# Patient Record
Sex: Female | Born: 1980 | Race: White | Hispanic: No | Marital: Married | Smoking: Never smoker
Health system: Southern US, Community
[De-identification: ages and names within clinical notes are randomized; demographics above are authoritative.]

## PROBLEM LIST (undated history)

## (undated) DIAGNOSIS — K8020 Calculus of gallbladder without cholecystitis without obstruction: Secondary | ICD-10-CM

## (undated) DIAGNOSIS — K219 Gastro-esophageal reflux disease without esophagitis: Secondary | ICD-10-CM

## (undated) HISTORY — PX: WISDOM TOOTH EXTRACTION: SHX21

## (undated) HISTORY — DX: Calculus of gallbladder without cholecystitis without obstruction: K80.20

## (undated) HISTORY — PX: ROOT CANAL: SHX2363

## (undated) HISTORY — DX: Gastro-esophageal reflux disease without esophagitis: K21.9

---

## 2008-05-21 ENCOUNTER — Inpatient Hospital Stay (HOSPITAL_COMMUNITY): Admission: AD | Admit: 2008-05-21 | Discharge: 2008-05-21 | Payer: Self-pay | Admitting: Obstetrics & Gynecology

## 2010-05-24 NOTE — L&D Delivery Note (Signed)
Delivery Note At 5:14 PM a viable female was delivered via Vaginal, Spontaneous Delivery (Presentation: Left Occiput Anterior).  APGAR: 9, 9; weight 9 lb 4.2 oz (4201 g).   Placenta status: Intact, Spontaneous.  Cord: 3 vessels with the following complications: None.    Anesthesia: Epidural  Episiotomy: None Lacerations: 2nd degree Suture Repair: 2.0 vicryl Est. Blood Loss (mL):   Mom to postpartum.  Baby to nursery-stable.  JACKSON-MOORE,Lonna Rabold A 02/11/2011, 5:44 PM

## 2010-07-28 LAB — ABO/RH: RH Type: POSITIVE

## 2010-07-28 LAB — GC/CHLAMYDIA PROBE AMP, GENITAL
Chlamydia: NEGATIVE
Gonorrhea: NEGATIVE

## 2010-07-28 LAB — RUBELLA ANTIBODY, IGM: Rubella: IMMUNE

## 2010-07-28 LAB — HIV ANTIBODY (ROUTINE TESTING W REFLEX): HIV: NONREACTIVE

## 2010-07-28 LAB — ANTIBODY SCREEN: Antibody Screen: NEGATIVE

## 2011-02-04 ENCOUNTER — Other Ambulatory Visit: Payer: Self-pay | Admitting: Obstetrics & Gynecology

## 2011-02-04 DIAGNOSIS — O480 Post-term pregnancy: Secondary | ICD-10-CM

## 2011-02-07 ENCOUNTER — Inpatient Hospital Stay (HOSPITAL_COMMUNITY)
Admission: AD | Admit: 2011-02-07 | Discharge: 2011-02-07 | Disposition: A | Discharge status: Home Health | Payer: Medicaid Other | Source: Ambulatory Visit | Attending: Obstetrics & Gynecology | Admitting: Obstetrics & Gynecology

## 2011-02-07 ENCOUNTER — Encounter (HOSPITAL_COMMUNITY): Payer: Self-pay | Admitting: *Deleted

## 2011-02-07 DIAGNOSIS — O479 False labor, unspecified: Secondary | ICD-10-CM | POA: Insufficient documentation

## 2011-02-07 MED ORDER — ZOLPIDEM TARTRATE 5 MG PO TABS: 5.0000 mg | ORAL_TABLET | Freq: Once | ORAL | Status: DC

## 2011-02-07 NOTE — Progress Notes (Signed)
ambien offered to pt. Pt does not want ambien at this time.

## 2011-02-07 NOTE — Progress Notes (Signed)
Pt presents to [facility] for labor check.  efm and toco placed. Assessing.  

## 2011-02-07 NOTE — Progress Notes (Signed)
Dr. Gaynell Face notified of pt presenting for labor check.  Notified of reactive fetal strip with irregular ctx.  Notified of VE 2cm with no change from earlier this week.  Orders received to dc home.  Pt may have ambien if desires.

## 2011-02-07 NOTE — Progress Notes (Signed)
States she was 2/50 this past week.

## 2011-02-08 ENCOUNTER — Encounter (HOSPITAL_COMMUNITY): Payer: Self-pay | Admitting: *Deleted

## 2011-02-08 ENCOUNTER — Telehealth (HOSPITAL_COMMUNITY): Payer: Self-pay | Admitting: *Deleted

## 2011-02-08 NOTE — Telephone Encounter (Signed)
Preadmission screen  

## 2011-02-09 ENCOUNTER — Ambulatory Visit (HOSPITAL_COMMUNITY)
Admission: RE | Admit: 2011-02-09 | Discharge: 2011-02-09 | Disposition: A | Discharge status: Home / Self Care | Payer: Medicaid Other | Source: Ambulatory Visit | Attending: Obstetrics & Gynecology | Admitting: Obstetrics & Gynecology

## 2011-02-09 DIAGNOSIS — O480 Post-term pregnancy: Secondary | ICD-10-CM | POA: Insufficient documentation

## 2011-02-09 DIAGNOSIS — Z3689 Encounter for other specified antenatal screening: Secondary | ICD-10-CM | POA: Insufficient documentation

## 2011-02-10 ENCOUNTER — Inpatient Hospital Stay (HOSPITAL_COMMUNITY)
Admission: RE | Admit: 2011-02-10 | Discharge: 2011-02-13 | DRG: 775 | Disposition: A | Discharge status: Home / Self Care | Payer: Medicaid Other | Source: Ambulatory Visit | Attending: Obstetrics & Gynecology | Admitting: Obstetrics & Gynecology

## 2011-02-10 DIAGNOSIS — O480 Post-term pregnancy: Secondary | ICD-10-CM

## 2011-02-10 DIAGNOSIS — O9903 Anemia complicating the puerperium: Secondary | ICD-10-CM | POA: Diagnosis not present

## 2011-02-10 DIAGNOSIS — O99892 Other specified diseases and conditions complicating childbirth: Secondary | ICD-10-CM | POA: Diagnosis present

## 2011-02-10 DIAGNOSIS — Z22330 Carrier of Group B streptococcus: Secondary | ICD-10-CM

## 2011-02-10 DIAGNOSIS — D649 Anemia, unspecified: Secondary | ICD-10-CM | POA: Diagnosis not present

## 2011-02-10 DIAGNOSIS — D62 Acute posthemorrhagic anemia: Secondary | ICD-10-CM | POA: Diagnosis not present

## 2011-02-10 LAB — CBC
Hemoglobin: 11.8 g/dL — ABNORMAL LOW (ref 12.0–15.0)
MCHC: 32.6 g/dL (ref 30.0–36.0)
Platelets: 244 10*3/uL (ref 150–400)

## 2011-02-10 MED ORDER — LIDOCAINE HCL (PF) 1 % IJ SOLN: 30.0000 mL | INTRAMUSCULAR | Status: DC | PRN

## 2011-02-10 MED ORDER — ZOLPIDEM TARTRATE 10 MG PO TABS: 10.0000 mg | ORAL_TABLET | Freq: Every evening | ORAL | Status: DC | PRN

## 2011-02-10 MED ORDER — FLEET ENEMA 7-19 GM/118ML RE ENEM: 1.0000 | ENEMA | RECTAL | Status: DC | PRN

## 2011-02-10 MED ORDER — ACETAMINOPHEN 325 MG PO TABS: 650.0000 mg | ORAL_TABLET | ORAL | Status: DC | PRN

## 2011-02-10 MED ORDER — PENICILLIN G POTASSIUM 5000000 UNITS IJ SOLR: 2.5000 10*6.[IU] | INTRAVENOUS | Status: DC

## 2011-02-10 MED ORDER — IBUPROFEN 600 MG PO TABS: 600.0000 mg | ORAL_TABLET | Freq: Four times a day (QID) | ORAL | Status: DC | PRN

## 2011-02-10 MED ORDER — LACTATED RINGERS IV SOLN: INTRAVENOUS | Status: DC

## 2011-02-10 MED ORDER — SODIUM CHLORIDE 0.9 % IV SOLN: 250.0000 mL | INTRAVENOUS | Status: DC

## 2011-02-10 MED ORDER — TERBUTALINE SULFATE 1 MG/ML IJ SOLN: 0.2500 mg | Freq: Once | INTRAMUSCULAR | Status: AC | PRN

## 2011-02-10 MED ORDER — OXYCODONE-ACETAMINOPHEN 5-325 MG PO TABS: 2.0000 | ORAL_TABLET | ORAL | Status: DC | PRN

## 2011-02-10 MED ORDER — MISOPROSTOL 25 MCG QUARTER TABLET
25.0000 ug | ORAL_TABLET | ORAL | Status: DC | PRN
  Administered 2011-02-11: 25 ug via VAGINAL
  Filled 2011-02-10 (×2): qty 0.25

## 2011-02-10 MED ORDER — OXYTOCIN BOLUS FROM INFUSION
500.0000 mL | Freq: Once | INTRAVENOUS | Status: DC
  Filled 2011-02-10: qty 500

## 2011-02-10 MED ORDER — CITRIC ACID-SODIUM CITRATE 334-500 MG/5ML PO SOLN: 30.0000 mL | ORAL | Status: DC | PRN

## 2011-02-10 MED ORDER — LACTATED RINGERS IV SOLN
500.0000 mL | INTRAVENOUS | Status: DC | PRN
  Administered 2011-02-10: 1000 mL via INTRAVENOUS

## 2011-02-10 MED ORDER — SODIUM CHLORIDE 0.9 % IJ SOLN
3.0000 mL | Freq: Two times a day (BID) | INTRAMUSCULAR | Status: DC
  Administered 2011-02-10: 3 mL via INTRAVENOUS

## 2011-02-10 MED ORDER — ONDANSETRON HCL 4 MG/2ML IJ SOLN: 4.0000 mg | Freq: Four times a day (QID) | INTRAMUSCULAR | Status: DC | PRN

## 2011-02-10 MED ORDER — OXYTOCIN 20 UNITS IN LACTATED RINGERS INFUSION - SIMPLE: 125.0000 mL/h | Freq: Once | INTRAVENOUS | Status: DC

## 2011-02-10 MED ORDER — PENICILLIN G POTASSIUM 5000000 UNITS IJ SOLR: 5.0000 10*6.[IU] | Freq: Once | INTRAMUSCULAR | Status: DC

## 2011-02-10 MED ORDER — PENICILLIN G POTASSIUM 5000000 UNITS IJ SOLR
2.5000 10*6.[IU] | INTRAVENOUS | Status: DC
  Filled 2011-02-10: qty 2.5

## 2011-02-10 MED ORDER — PENICILLIN G POTASSIUM 5000000 UNITS IJ SOLR
5.0000 10*6.[IU] | Freq: Once | INTRAVENOUS | Status: DC
  Filled 2011-02-10: qty 5

## 2011-02-10 MED ORDER — SODIUM CHLORIDE 0.9 % IJ SOLN: 3.0000 mL | INTRAMUSCULAR | Status: DC | PRN

## 2011-02-11 ENCOUNTER — Encounter (HOSPITAL_COMMUNITY): Payer: Self-pay | Admitting: Anesthesiology

## 2011-02-11 ENCOUNTER — Encounter (HOSPITAL_COMMUNITY): Payer: Self-pay

## 2011-02-11 ENCOUNTER — Inpatient Hospital Stay (HOSPITAL_COMMUNITY): Payer: Medicaid Other | Admitting: Anesthesiology

## 2011-02-11 DIAGNOSIS — O480 Post-term pregnancy: Secondary | ICD-10-CM | POA: Diagnosis present

## 2011-02-11 LAB — RPR: RPR Ser Ql: NONREACTIVE

## 2011-02-11 MED ORDER — DIPHENHYDRAMINE HCL 50 MG/ML IJ SOLN: 12.5000 mg | INTRAMUSCULAR | Status: DC | PRN

## 2011-02-11 MED ORDER — FENTANYL 2.5 MCG/ML BUPIVACAINE 1/10 % EPIDURAL INFUSION (WH - ANES)
14.0000 mL/h | INTRAMUSCULAR | Status: DC
  Administered 2011-02-11 (×2): 14 mL/h via EPIDURAL
  Filled 2011-02-11 (×2): qty 60

## 2011-02-11 MED ORDER — CITRIC ACID-SODIUM CITRATE 334-500 MG/5ML PO SOLN: 30.0000 mL | ORAL | Status: DC | PRN

## 2011-02-11 MED ORDER — LIDOCAINE HCL (PF) 1 % IJ SOLN
30.0000 mL | INTRAMUSCULAR | Status: DC | PRN
  Administered 2011-02-11: 30 mL via SUBCUTANEOUS
  Filled 2011-02-11: qty 30

## 2011-02-11 MED ORDER — FENTANYL 2.5 MCG/ML BUPIVACAINE 1/10 % EPIDURAL INFUSION (WH - ANES)
INTRAMUSCULAR | Status: AC
  Administered 2011-02-11: 14 mL/h via EPIDURAL
  Filled 2011-02-11: qty 60

## 2011-02-11 MED ORDER — SODIUM CHLORIDE 0.9 % IJ SOLN: 3.0000 mL | INTRAMUSCULAR | Status: DC | PRN

## 2011-02-11 MED ORDER — FLEET ENEMA 7-19 GM/118ML RE ENEM: 1.0000 | ENEMA | RECTAL | Status: DC | PRN

## 2011-02-11 MED ORDER — BENZOCAINE-MENTHOL 20-0.5 % EX AERO: 1.0000 "application " | INHALATION_SPRAY | CUTANEOUS | Status: DC | PRN

## 2011-02-11 MED ORDER — EPHEDRINE 5 MG/ML INJ
10.0000 mg | INTRAVENOUS | Status: DC | PRN
  Administered 2011-02-11: 10 mg via INTRAVENOUS
  Filled 2011-02-11: qty 4

## 2011-02-11 MED ORDER — MAGNESIUM HYDROXIDE 400 MG/5ML PO SUSP: 30.0000 mL | ORAL | Status: DC | PRN

## 2011-02-11 MED ORDER — ONDANSETRON HCL 4 MG/2ML IJ SOLN: 4.0000 mg | Freq: Four times a day (QID) | INTRAMUSCULAR | Status: DC | PRN

## 2011-02-11 MED ORDER — ZOLPIDEM TARTRATE 5 MG PO TABS: 5.0000 mg | ORAL_TABLET | Freq: Every evening | ORAL | Status: DC | PRN

## 2011-02-11 MED ORDER — LACTATED RINGERS IV SOLN: 500.0000 mL | INTRAVENOUS | Status: DC | PRN

## 2011-02-11 MED ORDER — PHENYLEPHRINE 40 MCG/ML (10ML) SYRINGE FOR IV PUSH (FOR BLOOD PRESSURE SUPPORT)
80.0000 ug | PREFILLED_SYRINGE | INTRAVENOUS | Status: DC | PRN
  Filled 2011-02-11: qty 5

## 2011-02-11 MED ORDER — MISOPROSTOL 25 MCG QUARTER TABLET
25.0000 ug | ORAL_TABLET | ORAL | Status: DC | PRN
  Filled 2011-02-11: qty 1

## 2011-02-11 MED ORDER — ACETAMINOPHEN 325 MG PO TABS: 650.0000 mg | ORAL_TABLET | ORAL | Status: DC | PRN

## 2011-02-11 MED ORDER — IBUPROFEN 600 MG PO TABS
600.0000 mg | ORAL_TABLET | Freq: Four times a day (QID) | ORAL | Status: DC | PRN
  Administered 2011-02-11: 600 mg via ORAL
  Filled 2011-02-11: qty 1

## 2011-02-11 MED ORDER — OXYTOCIN BOLUS FROM INFUSION
500.0000 mL | Freq: Once | INTRAVENOUS | Status: DC
  Filled 2011-02-11: qty 500

## 2011-02-11 MED ORDER — BUTORPHANOL TARTRATE 2 MG/ML IJ SOLN
2.0000 mg | Freq: Once | INTRAMUSCULAR | Status: AC
  Administered 2011-02-11: 2 mg via INTRAVENOUS

## 2011-02-11 MED ORDER — PENICILLIN G POTASSIUM 5000000 UNITS IJ SOLR: 5.0000 10*6.[IU] | Freq: Once | INTRAVENOUS | Status: DC

## 2011-02-11 MED ORDER — LACTATED RINGERS IV SOLN: INTRAVENOUS | Status: DC

## 2011-02-11 MED ORDER — ONDANSETRON HCL 4 MG/2ML IJ SOLN: 4.0000 mg | INTRAMUSCULAR | Status: DC | PRN

## 2011-02-11 MED ORDER — BUTORPHANOL TARTRATE 2 MG/ML IJ SOLN
INTRAMUSCULAR | Status: AC
  Administered 2011-02-11: 2 mg via INTRAVENOUS
  Filled 2011-02-11: qty 1

## 2011-02-11 MED ORDER — IBUPROFEN 600 MG PO TABS: 600.0000 mg | ORAL_TABLET | Freq: Four times a day (QID) | ORAL | Status: DC | PRN

## 2011-02-11 MED ORDER — PENICILLIN G POTASSIUM 5000000 UNITS IJ SOLR
5.0000 10*6.[IU] | Freq: Once | INTRAMUSCULAR | Status: AC
  Administered 2011-02-11: 5 10*6.[IU] via INTRAVENOUS
  Filled 2011-02-11: qty 5

## 2011-02-11 MED ORDER — SENNOSIDES-DOCUSATE SODIUM 8.6-50 MG PO TABS
2.0000 | ORAL_TABLET | Freq: Every day | ORAL | Status: DC
  Administered 2011-02-12: 2 via ORAL

## 2011-02-11 MED ORDER — OXYTOCIN BOLUS FROM INFUSION: 500.0000 mL | Freq: Once | INTRAVENOUS | Status: DC

## 2011-02-11 MED ORDER — PRENATAL PLUS 27-1 MG PO TABS
1.0000 | ORAL_TABLET | Freq: Every day | ORAL | Status: DC
  Administered 2011-02-12 – 2011-02-13 (×2): 1 via ORAL
  Filled 2011-02-11 (×2): qty 1

## 2011-02-11 MED ORDER — MEDROXYPROGESTERONE ACETATE 150 MG/ML IM SUSP: 150.0000 mg | INTRAMUSCULAR | Status: DC | PRN

## 2011-02-11 MED ORDER — SODIUM CHLORIDE 0.9 % IV SOLN: 250.0000 mL | INTRAVENOUS | Status: DC

## 2011-02-11 MED ORDER — ONDANSETRON HCL 4 MG PO TABS: 4.0000 mg | ORAL_TABLET | ORAL | Status: DC | PRN

## 2011-02-11 MED ORDER — TERBUTALINE SULFATE 1 MG/ML IJ SOLN: 0.2500 mg | Freq: Once | INTRAMUSCULAR | Status: AC | PRN

## 2011-02-11 MED ORDER — DIPHENHYDRAMINE HCL 25 MG PO CAPS: 25.0000 mg | ORAL_CAPSULE | Freq: Four times a day (QID) | ORAL | Status: DC | PRN

## 2011-02-11 MED ORDER — SODIUM CHLORIDE 0.9 % IJ SOLN: 3.0000 mL | Freq: Two times a day (BID) | INTRAMUSCULAR | Status: DC

## 2011-02-11 MED ORDER — LACTATED RINGERS IV SOLN: 500.0000 mL | Freq: Once | INTRAVENOUS | Status: DC

## 2011-02-11 MED ORDER — OXYCODONE-ACETAMINOPHEN 5-325 MG PO TABS: 2.0000 | ORAL_TABLET | ORAL | Status: DC | PRN

## 2011-02-11 MED ORDER — TETANUS-DIPHTH-ACELL PERTUSSIS 5-2.5-18.5 LF-MCG/0.5 IM SUSP: 0.5000 mL | Freq: Once | INTRAMUSCULAR | Status: DC

## 2011-02-11 MED ORDER — FENTANYL 2.5 MCG/ML BUPIVACAINE 1/10 % EPIDURAL INFUSION (WH - ANES)
INTRAMUSCULAR | Status: DC | PRN
  Administered 2011-02-11: 14 mL/h via EPIDURAL

## 2011-02-11 MED ORDER — TERBUTALINE SULFATE 1 MG/ML IJ SOLN: 0.2500 mg | Freq: Once | INTRAMUSCULAR | Status: DC | PRN

## 2011-02-11 MED ORDER — IBUPROFEN 600 MG PO TABS
600.0000 mg | ORAL_TABLET | Freq: Four times a day (QID) | ORAL | Status: DC
  Administered 2011-02-11 – 2011-02-13 (×7): 600 mg via ORAL
  Filled 2011-02-11 (×6): qty 1

## 2011-02-11 MED ORDER — OXYTOCIN 20 UNITS IN LACTATED RINGERS INFUSION - SIMPLE
1.0000 m[IU]/min | INTRAVENOUS | Status: DC
  Administered 2011-02-11: 333 m[IU]/min via INTRAVENOUS
  Administered 2011-02-11: 4 m[IU]/min via INTRAVENOUS
  Administered 2011-02-11: 2 m[IU]/min via INTRAVENOUS
  Filled 2011-02-11: qty 1000

## 2011-02-11 MED ORDER — OXYTOCIN 20 UNITS IN LACTATED RINGERS INFUSION - SIMPLE: 125.0000 mL/h | Freq: Once | INTRAVENOUS | Status: DC

## 2011-02-11 MED ORDER — SODIUM CHLORIDE 0.9 % IJ SOLN
3.0000 mL | INTRAMUSCULAR | Status: DC | PRN
  Administered 2011-02-11: 3 mL via INTRAVENOUS

## 2011-02-11 MED ORDER — DIBUCAINE 1 % RE OINT: 1.0000 "application " | TOPICAL_OINTMENT | RECTAL | Status: DC | PRN

## 2011-02-11 MED ORDER — BENZOCAINE-MENTHOL 20-0.5 % EX AERO
INHALATION_SPRAY | CUTANEOUS | Status: AC
  Filled 2011-02-11: qty 56

## 2011-02-11 MED ORDER — LIDOCAINE HCL 1.5 % IJ SOLN
INTRAMUSCULAR | Status: DC | PRN
  Administered 2011-02-11 (×2): 5 mL via EPIDURAL

## 2011-02-11 MED ORDER — DEXTROSE 5 % IV SOLN: 2.5000 10*6.[IU] | INTRAVENOUS | Status: DC

## 2011-02-11 MED ORDER — MEASLES, MUMPS & RUBELLA VAC ~~LOC~~ INJ: 0.5000 mL | INJECTION | Freq: Once | SUBCUTANEOUS | Status: DC

## 2011-02-11 MED ORDER — PENICILLIN G POTASSIUM 5000000 UNITS IJ SOLR
2.5000 10*6.[IU] | INTRAMUSCULAR | Status: DC
  Administered 2011-02-11 (×2): 2.5 10*6.[IU] via INTRAVENOUS
  Filled 2011-02-11 (×8): qty 2.5

## 2011-02-11 MED ORDER — PHENYLEPHRINE 40 MCG/ML (10ML) SYRINGE FOR IV PUSH (FOR BLOOD PRESSURE SUPPORT)
PREFILLED_SYRINGE | INTRAVENOUS | Status: AC
  Filled 2011-02-11: qty 5

## 2011-02-11 MED ORDER — LANOLIN HYDROUS EX OINT: TOPICAL_OINTMENT | CUTANEOUS | Status: DC | PRN

## 2011-02-11 MED ORDER — WITCH HAZEL-GLYCERIN EX PADS: 1.0000 "application " | MEDICATED_PAD | CUTANEOUS | Status: DC | PRN

## 2011-02-11 MED ORDER — EPHEDRINE 5 MG/ML INJ
10.0000 mg | INTRAVENOUS | Status: DC | PRN
  Filled 2011-02-11: qty 4

## 2011-02-11 MED ORDER — EPHEDRINE 5 MG/ML INJ
INTRAVENOUS | Status: AC
  Administered 2011-02-11: 10 mg via INTRAVENOUS
  Filled 2011-02-11: qty 4

## 2011-02-11 MED ORDER — OXYCODONE-ACETAMINOPHEN 5-325 MG PO TABS: 1.0000 | ORAL_TABLET | ORAL | Status: DC | PRN

## 2011-02-11 MED ORDER — FERROUS SULFATE 325 (65 FE) MG PO TABS
325.0000 mg | ORAL_TABLET | Freq: Two times a day (BID) | ORAL | Status: DC
  Administered 2011-02-12 – 2011-02-13 (×2): 325 mg via ORAL
  Filled 2011-02-11 (×3): qty 1

## 2011-02-11 MED ORDER — LIDOCAINE HCL (PF) 1 % IJ SOLN: 30.0000 mL | INTRAMUSCULAR | Status: DC | PRN

## 2011-02-11 MED ORDER — LACTATED RINGERS IV SOLN
INTRAVENOUS | Status: DC
  Administered 2011-02-11 (×3): via INTRAVENOUS

## 2011-02-11 NOTE — Anesthesia Procedure Notes (Signed)
Epidural Patient location during procedure: OB Start time: 02/11/2011 8:04 AM End time: 02/11/2011 8:10 AM Reason for block: procedure for pain  Staffing Anesthesiologist: Sandrea Hughs Performed by: anesthesiologist   Preanesthetic Checklist Completed: patient identified, site marked, surgical consent, pre-op evaluation, timeout performed, IV checked, risks and benefits discussed and monitors and equipment checked  Epidural Patient position: sitting Prep: site prepped and draped and DuraPrep Patient monitoring: continuous pulse ox and blood pressure Approach: midline Injection technique: LOR air  Needle:  Needle type: Tuohy  Needle gauge: 17 G Needle length: 9 cm Needle insertion depth: 6 cm Catheter type: closed end flexible Catheter size: 19 Gauge Catheter at skin depth: 11 cm Test dose: negative and 1.5% lidocaine  Assessment Events: blood not aspirated, injection not painful, no injection resistance, negative IV test and no paresthesia

## 2011-02-11 NOTE — Anesthesia Preprocedure Evaluation (Signed)
Anesthesia Evaluation  Name, MR# and DOB Patient awake  General Assessment Comment  Reviewed: Allergy & Precautions, H&P , NPO status , Patient's Chart, lab work & pertinent test results  Airway Mallampati: II TM Distance: >3 FB Neck ROM: full    Dental No notable dental hx.    Pulmonary  clear to auscultation  pulmonary exam normalPulmonary Exam Normal breath sounds clear to auscultation none    Cardiovascular     Neuro/Psych Negative Neurological ROS  Negative Psych ROS  GI/Hepatic/Renal   negative Liver ROS  negative Renal ROS        Endo/Other  Negative Endocrine ROS (+)      Abdominal (+) obese,   Musculoskeletal negative musculoskeletal ROS (+)   Hematology negative hematology ROS (+)   Peds  Reproductive/Obstetrics (+) Pregnancy    Anesthesia Other Findings             Anesthesia Physical Anesthesia Plan  ASA: III  Anesthesia Plan: Epidural   Post-op Pain Management:    Induction:   Airway Management Planned:   Additional Equipment:   Intra-op Plan:   Post-operative Plan:   Informed Consent: I have reviewed the patients History and Physical, chart, labs and discussed the procedure including the risks, benefits and alternatives for the proposed anesthesia with the patient or authorized representative who has indicated his/her understanding and acceptance.     Plan Discussed with:   Anesthesia Plan Comments:         Anesthesia Quick Evaluation

## 2011-02-11 NOTE — H&P (Signed)
Ashley Best is a 30 y.o. female presenting for IOL secondary to post dates. Maternal Medical History:  Reason for admission: IOL  Fetal activity: Perceived fetal activity is normal.    Prenatal complications: no prenatal complications   OB History    Grav Para Term Preterm Abortions TAB SAB Ect Mult Living   1              Past Medical History  Diagnosis Date  . No pertinent past medical history   . GERD (gastroesophageal reflux disease)    Past Surgical History  Procedure Date  . Wisdom tooth extraction   . Root canal    Family History: family history includes Cancer in her paternal grandmother; Diabetes in her father; Early death in her maternal grandfather; Heart attack in her paternal grandfather and paternal uncle; Hypertension in her father and paternal grandfather; and Stroke in her maternal grandfather. Social History:  reports that she has never smoked. She does not have any smokeless tobacco history on file. She reports that she does not drink alcohol or use illicit drugs.  Review of Systems  Constitutional: Negative for fever.  Eyes: Negative for blurred vision.  Respiratory: Negative for shortness of breath.   Gastrointestinal: Negative for vomiting.  Skin: Negative for rash.  Neurological: Negative for headaches.    Dilation: 2 Effacement (%): 70 Station: -2 Exam by:: Ashley German RN Blood pressure 99/50, pulse 74, temperature 97.9 F (36.6 C), temperature source Oral, resp. rate 18, height 5\' 7"  (1.702 m), weight 102.513 kg (226 lb). Maternal Exam:  Abdomen: Patient reports no abdominal tenderness. Fetal presentation: vertex  Introitus: not evaluated.     Fetal Exam Fetal Monitor Review: Variability: moderate (6-25 bpm).   Pattern: accelerations present and no decelerations.    Fetal State Assessment: Category I - tracings are normal.     Physical Exam  Constitutional: She appears well-developed.  HENT:  Head: Normocephalic.  Neck: Neck  supple. No thyromegaly present.  Cardiovascular: Normal rate and regular rhythm.   Respiratory: Breath sounds normal.  GI: Soft. Bowel sounds are normal.  Skin: No rash noted.    Prenatal labs: ABO, Rh: O/Positive/-- (03/06 0000) Antibody: Negative (03/06 0000) Rubella:   RPR: NON REACTIVE (09/19 2345)  HBsAg: Negative (03/06 0000)  HIV: Non-reactive (03/06 0000)  GBS: Positive (08/18 0000)   Assessment/Plan: 30 y.o. w/an IUP @ [redacted]w[redacted]d for IOL secondary to post dates GBS positive  Admit Two-stage IOL GBS prophylaxis   JACKSON-MOORE,Samyria Rudie A 02/11/2011, 3:58 AM

## 2011-02-11 NOTE — Progress Notes (Signed)
Johara Lodwick is a 30 y.o. G1P0 at [redacted]w[redacted]d by LMP admitted for induction of labor due to Post dates. Due date 9/13.  Subjective: Comfortable  Objective: BP 101/55  Pulse 76  Temp(Src) 97.9 F (36.6 C) (Oral)  Resp 18  Ht 5\' 7"  (1.702 m)  Wt 102.513 kg (226 lb)  BMI 35.40 kg/m2  SpO2 96%      FHT:  FHR: 140 bpm, variability: moderate,  accelerations:  Present,  decelerations:  Absent UC:   regular, every 2 minutes SVE:   Dilation: 4 Effacement (%): 90 Station: -1 Exam by:: jackson-moore (IUPC placed)  Labs: Lab Results  Component Value Date   WBC 11.2* 02/10/2011   HGB 11.8* 02/10/2011   HCT 36.2 02/10/2011   MCV 85.8 02/10/2011   PLT 244 02/10/2011    Assessment / Plan: Induction of labor due to postterm,  progressing well on pitocin  Labor: Progressing normally Preeclampsia:  N/A Fetal Wellbeing:  Category I Pain Control:  Epidural I/D:  n/a Anticipated MOD:  NSVD  JACKSON-MOORE,Arlone Lenhardt A 02/11/2011, 9:24 AM

## 2011-02-12 LAB — CBC
MCH: 28.2 pg (ref 26.0–34.0)
MCHC: 32.8 g/dL (ref 30.0–36.0)
MCV: 85.8 fL (ref 78.0–100.0)
Platelets: 181 10*3/uL (ref 150–400)
RBC: 3.16 MIL/uL — ABNORMAL LOW (ref 3.87–5.11)

## 2011-02-12 MED ORDER — METHYLERGONOVINE MALEATE 0.2 MG/ML IJ SOLN: 0.2000 mg | INTRAMUSCULAR | Status: DC | PRN

## 2011-02-12 MED ORDER — INFLUENZA VIRUS VACC SPLIT PF IM SUSP
0.5000 mL | INTRAMUSCULAR | Status: AC | PRN
  Administered 2011-02-13: 0.5 mL via INTRAMUSCULAR
  Filled 2011-02-12: qty 0.5

## 2011-02-12 MED ORDER — METHYLERGONOVINE MALEATE 0.2 MG PO TABS: 0.2000 mg | ORAL_TABLET | ORAL | Status: DC | PRN

## 2011-02-12 NOTE — Anesthesia Postprocedure Evaluation (Signed)
Anesthesia Post Note  Patient: Ashley Best  Procedure(s) Performed: * No procedures listed *  Anesthesia type: Epidural  Patient location: Mother/Baby  Post pain: Pain level controlled  Post assessment: Post-op Vital signs reviewed  Last Vitals:  Filed Vitals:   02/12/11 0600  BP: 99/64  Pulse: 71  Temp: 97.9 F (36.6 C)  Resp: 18    Post vital signs: Reviewed  Level of consciousness: awake  Complications: No apparent anesthesia complications

## 2011-02-12 NOTE — Progress Notes (Signed)
UR Chart review completed.  

## 2011-02-12 NOTE — Progress Notes (Signed)
  Post Partum Day 1 S/P spontaneous vaginal RH status/Rubella reviewed.  Feeding: breast Subjective: No HA, SOB, CP, F/C, breast symptoms. Increased vaginal bleeding with small clots. [place].    Objective: BP 99/64  Pulse 71  Temp(Src) 97.9 F (36.6 C) (Oral)  Resp 18  Ht 5\' 7"  (1.702 m)  Wt 102.513 kg (226 lb)  BMI 35.40 kg/m2  SpO2 96%  Breastfeeding? Unknown   Physical Exam:  General: alert Lochia: Moderate Uterine Fundus: firm DVT Evaluation: No evidence of DVT seen on physical exam. Ext: No c/c/e  Basename 02/12/11 0550 02/10/11 2345  HGB 8.9* 11.8*  HCT 27.1* 36.2      Assessment/Plan: 30 y.o.  PPD #1 .  normal postpartum exam Increased lochia, large infant; Anemia; ?orthostatic symptoms  Check orthostatic vitals Methergine Recheck Hgb 9/22 Continue current postpartum care     LOS: 2 days   JACKSON-MOORE,Korbyn Vanes A 02/12/2011, 10:07 AM

## 2011-02-13 DIAGNOSIS — D62 Acute posthemorrhagic anemia: Secondary | ICD-10-CM | POA: Diagnosis not present

## 2011-02-13 LAB — HEMOGLOBIN: Hemoglobin: 7.8 g/dL — ABNORMAL LOW (ref 12.0–15.0)

## 2011-02-13 MED ORDER — FERROUS SULFATE 325 (65 FE) MG PO TABS: 325.0000 mg | ORAL_TABLET | Freq: Two times a day (BID) | ORAL | Status: DC

## 2011-02-13 MED ORDER — IBUPROFEN 600 MG PO TABS: 600.0000 mg | ORAL_TABLET | Freq: Four times a day (QID) | ORAL | Status: AC

## 2011-02-13 NOTE — Anesthesia Postprocedure Evaluation (Signed)
  Anesthesia Post-op Note  Patient: Ashley Best  Procedure(s) Performed: * No procedures listed *  Patient Location: PACU and Mother/Baby  Anesthesia Type: Epidural  Level of Consciousness: awake, alert  and oriented  Airway and Oxygen Therapy: Patient Spontanous Breathing  Post-op Assessment: Patient's Cardiovascular Status Stable and Respiratory Function Stable  Post-op Vital Signs: stable  Complications: No apparent anesthesia complications

## 2011-02-13 NOTE — Progress Notes (Signed)
Encounter addended by: Len Blalock on: 02/13/2011  9:12 AM<BR>     Documentation filed: Notes Section, Charges VN

## 2011-02-13 NOTE — Progress Notes (Signed)
Post Partum Day #2 S/P:spontaneous vaginal  RH status/Rubella reviewed.  Feeding: breast Subjective: No HA, SOB, CP, F/C, breast symptoms: No. Normal vaginal bleeding, no clots.     Objective:  Blood pressure 104/64, pulse 83, temperature 97.5 F (36.4 C), temperature source Oral, resp. rate 18, height 5\' 7"  (1.702 m), weight 102.513 kg (226 lb), SpO2 96.00%, unknown if currently breastfeeding.   Physical Exam:  General: alert Lochia: appropriate Uterine Fundus: firm DVT Evaluation: No evidence of DVT seen on physical exam. Ext: No c/c/e  Basename 02/13/11 0556 02/12/11 0550 02/10/11 2345  HGB 7.8* 8.9* --  HCT -- 27.1* 36.2    Assessment/Plan: 30 y.o.  PPD # 2 .  normal postpartum exam Anemia stable/lochia decreased/no orthostatic symptoms Continue current postpartum care D/C home   LOS: 3 days   JACKSON-MOORE,Gretchen Weinfeld A 02/13/2011, 10:34 AM

## 2011-02-13 NOTE — Discharge Summary (Signed)
Obstetric Discharge Summary Reason for Admission: induction of labor Prenatal Procedures: none Intrapartum Procedures: spontaneous vaginal delivery Postpartum Procedures: none Complications-Operative and Postpartum: hemorrhage Hemoglobin  Date Value Range Status  02/13/2011 7.8* 12.0-15.0 (g/dL) Final     HCT  Date Value Range Status  02/12/2011 27.1* 36.0-46.0 (%) Final    Discharge Diagnoses: Term Pregnancy-delivered and post partum hemorrhage  Discharge Information: Date: 02/13/2011 Activity: pelvic rest Diet: routine Medications: PNV, Ibuprophen and Iron Condition: stable Instructions: refer to practice specific booklet and See above Discharge to: home Follow-up Information    Follow up with Antionette Char A, MD. Call in 3 weeks.   Contact information:   [address] [place] [address] [phone]          Newborn Data: Live born female  Birth Weight: 9 lb 4.2 oz (4201 g) APGAR: 9, 9  Home with mother.  JACKSON-MOORE,Dainel Arcidiacono A 02/13/2011, 10:37 AM

## 2011-02-26 LAB — WET PREP, GENITAL: Yeast Wet Prep HPF POC: NONE SEEN

## 2012-05-05 IMAGING — US US FETAL BPP W/O NONSTRESS
1 series · 10 of 10 positions shown · non-contrast
Comparison: none

[Series 1: us fetal bpp w/o nonstress · 10 acquisitions, 10 frames shown]
[im 1/10]
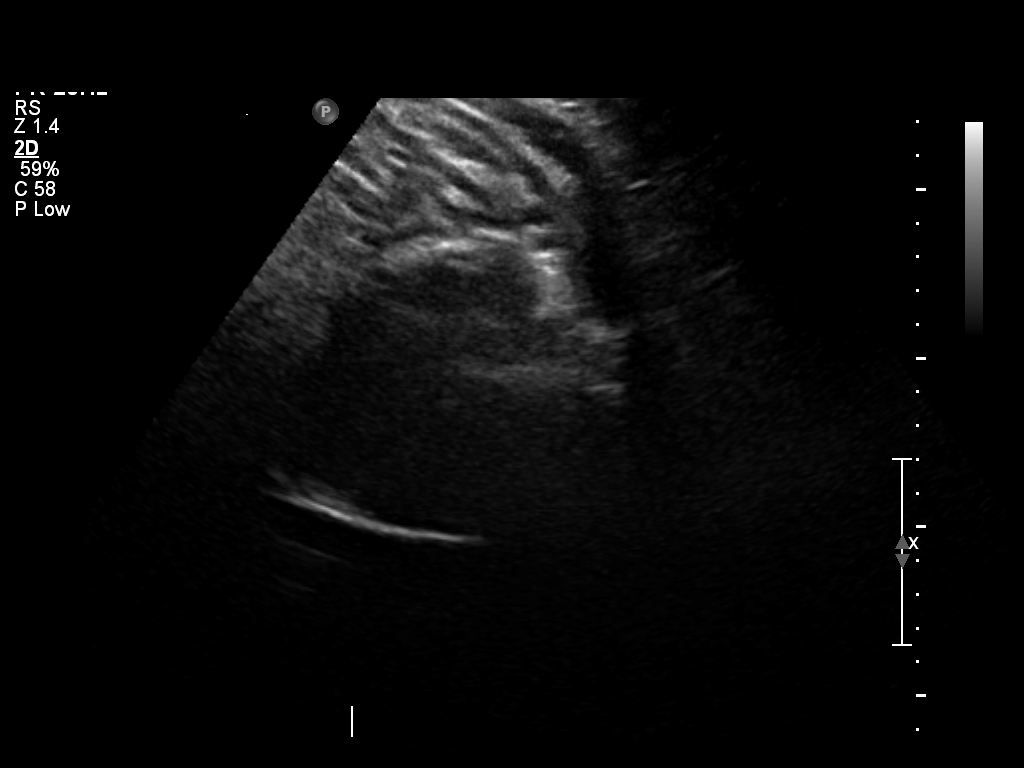
[im 2/10]
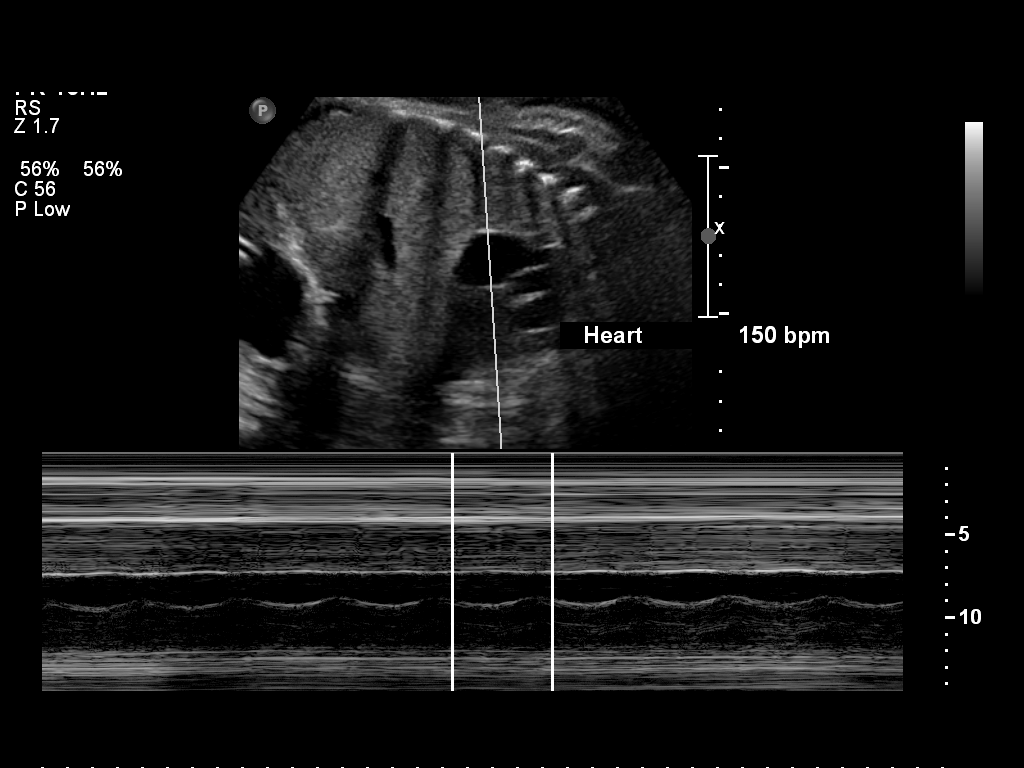
[im 3/10]
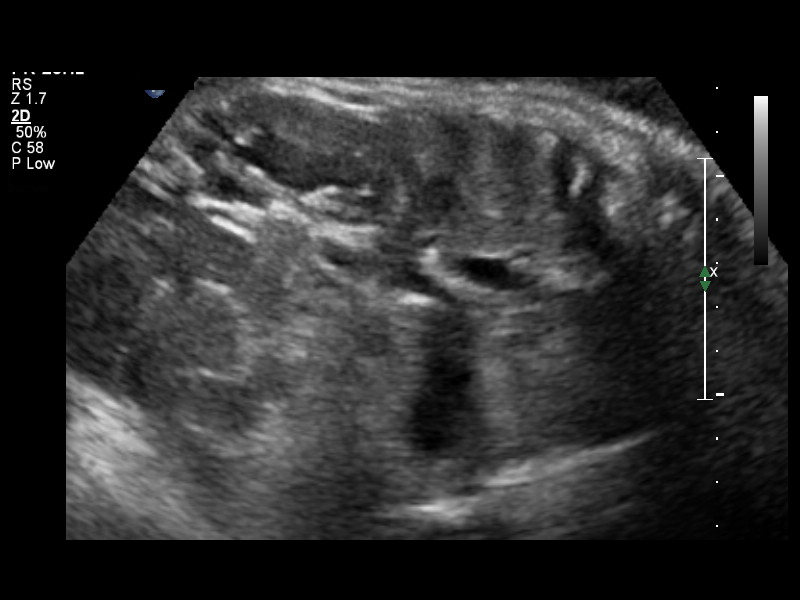
[im 4/10]
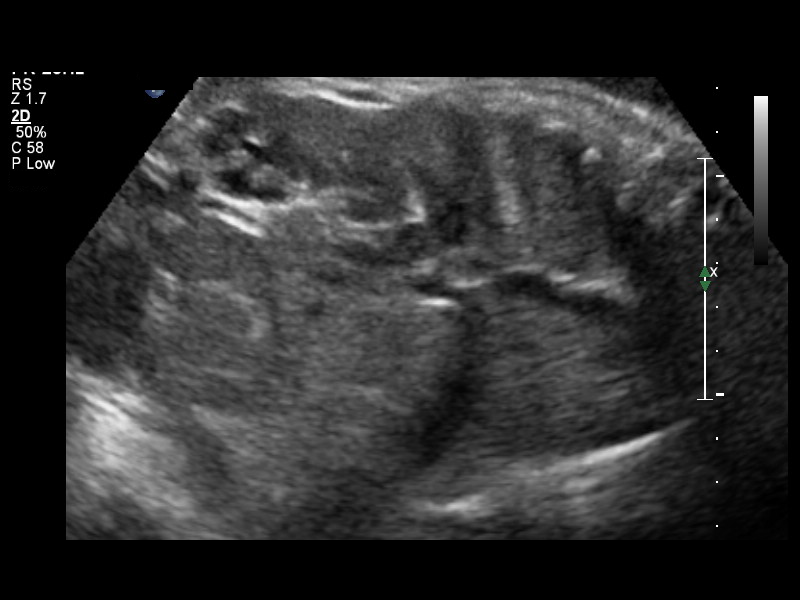
[im 5/10]
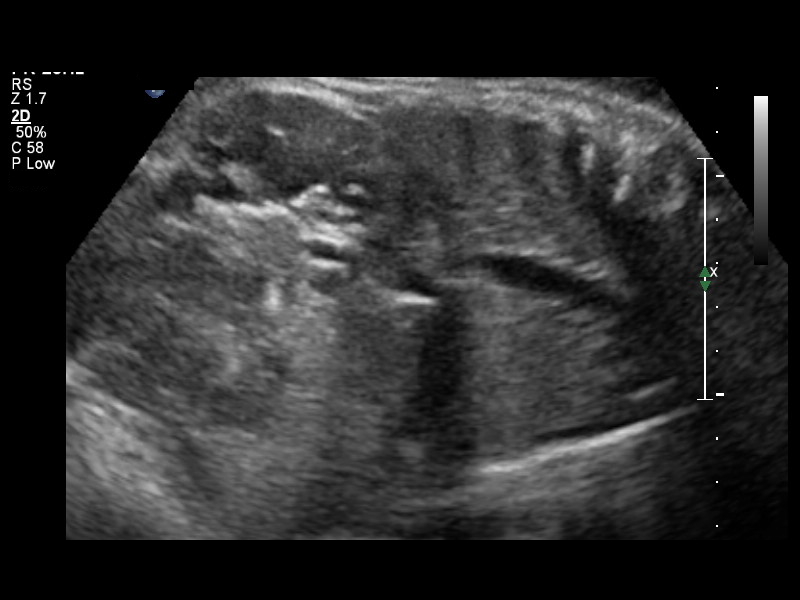
[im 6/10]
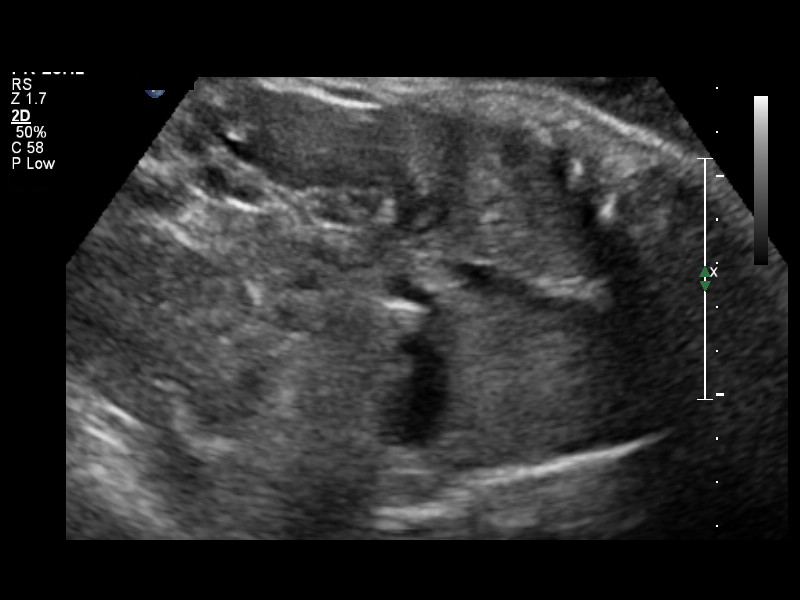
[im 7/10]
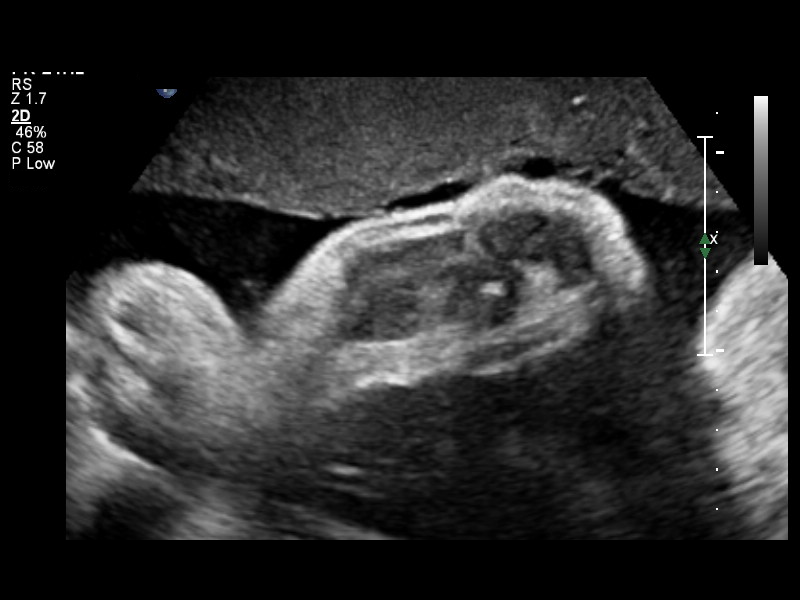
[im 8/10]
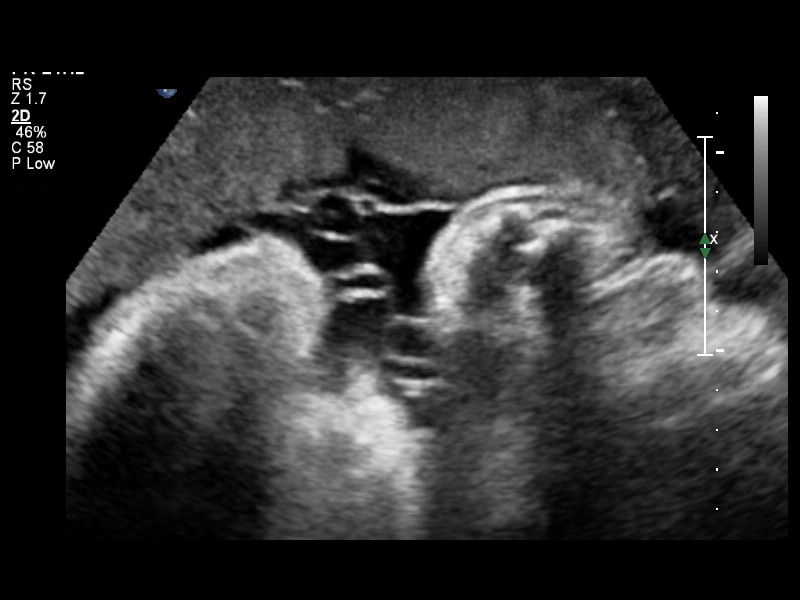
[im 9/10]
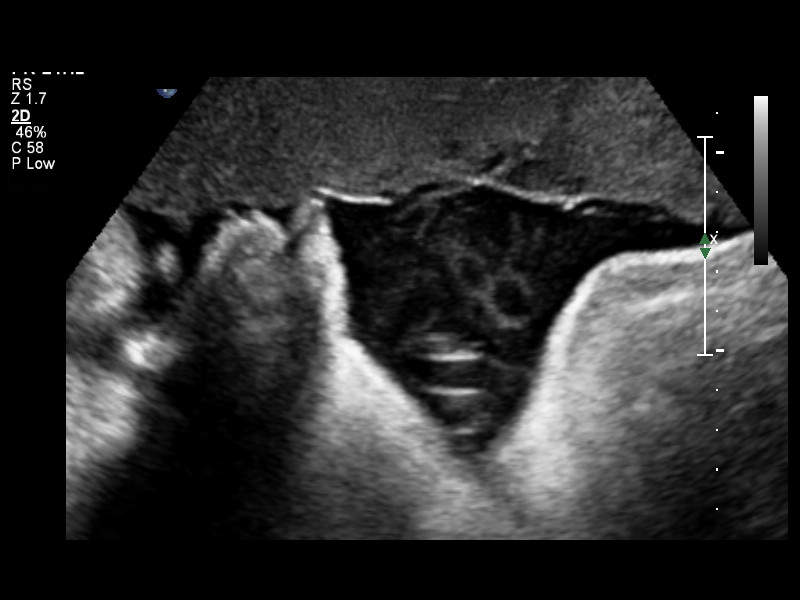
[im 10/10]
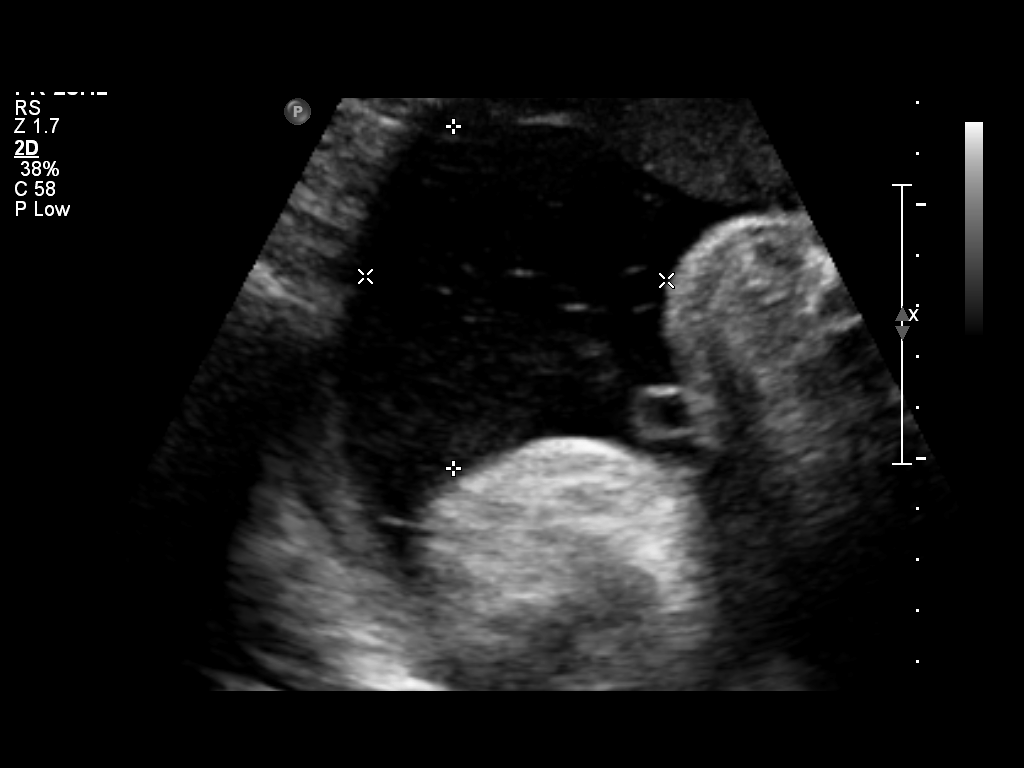

[10 of 10 positions shown; findings below may reference images not displayed]

Canned report from images found in remote index.

Refer to host system for actual result text.

## 2012-12-26 ENCOUNTER — Emergency Department (HOSPITAL_COMMUNITY)
Admission: EM | Admit: 2012-12-26 | Discharge: 2012-12-26 | Disposition: A | Discharge status: Home / Self Care | Payer: BC Managed Care – PPO | Source: Home / Self Care

## 2012-12-26 ENCOUNTER — Emergency Department (INDEPENDENT_AMBULATORY_CARE_PROVIDER_SITE_OTHER): Payer: BC Managed Care – PPO

## 2012-12-26 ENCOUNTER — Emergency Department (HOSPITAL_COMMUNITY): Payer: BC Managed Care – PPO

## 2012-12-26 DIAGNOSIS — J309 Allergic rhinitis, unspecified: Secondary | ICD-10-CM

## 2012-12-26 LAB — POCT PREGNANCY, URINE: Preg Test, Ur: NEGATIVE

## 2012-12-26 MED ORDER — FLUTICASONE PROPIONATE 50 MCG/ACT NA SUSP: 2.0000 | Freq: Every day | NASAL | Status: DC

## 2012-12-26 MED ORDER — FEXOFENADINE-PSEUDOEPHED ER 60-120 MG PO TB12: 1.0000 | ORAL_TABLET | Freq: Two times a day (BID) | ORAL | Status: DC

## 2012-12-26 MED ORDER — HYDROCOD POLST-CHLORPHEN POLST 10-8 MG/5ML PO LQCR: 5.0000 mL | Freq: Two times a day (BID) | ORAL | Status: DC | PRN

## 2012-12-26 NOTE — ED Notes (Signed)
Patient finally got information from insurance company that she had coverage for CXR. Took patient to x-ray room for CXR asked for LMP she replied that her LMP was 7/25, informed me she could be pregnant. Delay in CXR for results of pregnancy test.

## 2012-12-26 NOTE — ED Notes (Signed)
Patient was not agreeing to CXR until she talked with her insurance company. Patient was wanting to be treated without the CXR. Zonia Kief PA explained that treatment included a CXR.

## 2012-12-26 NOTE — ED Notes (Signed)
C/o cough.  Patient states she does have a little congestion.  Patient states her cough is sometimes productive.  Patient states she coughs more at night and is not able to sleep.   OTC medications taken with decongestant but no relief.

## 2012-12-28 NOTE — ED Provider Notes (Signed)
CSN: [id number]     Arrival date & time 12/26/12  0914 History     First MD Initiated Contact with Patient 12/26/12 [phone]     Chief Complaint  Patient presents with  . Cough   (Consider location/radiation/quality/duration/timing/severity/associated sxs/prior Treatment) HPI  32 yo female presents with the above complaint.  Symptoms ongoing for a few days.  C/o cough, nasal congestion, runny nose.  Denies fever, chills, sob, cp, nausea,vomiting.  Has tried otc meds without relief.    Past Medical History  Diagnosis Date  . No pertinent past medical history   . GERD (gastroesophageal reflux disease)    Past Surgical History  Procedure Laterality Date  . Wisdom tooth extraction    . Root canal     Family History  Problem Relation Age of Onset  . Hypertension Father   . Diabetes Father   . Heart attack Paternal Uncle   . Stroke Maternal Grandfather   . Early death Maternal Grandfather     MGF anneurysm from war  . Cancer Paternal Grandmother   . Hypertension Paternal Grandfather   . Heart attack Paternal Grandfather    History  Substance Use Topics  . Smoking status: Never Smoker   . Smokeless tobacco: Not on file  . Alcohol Use: No   OB History   Grav Para Term Preterm Abortions TAB SAB Ect Mult Living   1 1 1       1      Review of Systems  Constitutional: Negative.   HENT: Positive for congestion, rhinorrhea, postnasal drip and sinus pressure. Negative for sore throat.   Eyes: Negative.   Respiratory: Positive for cough. Negative for apnea, choking, chest tightness, shortness of breath, wheezing and stridor.   Cardiovascular: Negative.   Gastrointestinal: Negative.   Endocrine: Negative.   Musculoskeletal: Negative.   Skin: Negative.   Neurological: Negative.   Psychiatric/Behavioral: Negative.     Allergies  Review of patient's allergies indicates no known allergies.  Home Medications   Current Outpatient Rx  Name  Route  Sig  Dispense  Refill  .  acetaminophen (TYLENOL) 500 MG tablet   Oral   Take 500 mg by mouth as needed. For headache          . Calcium Carbonate Antacid (TUMS PO)   Oral   Take 1 tablet by mouth as needed. For heartburn         . chlorpheniramine-HYDROcodone (TUSSIONEX PENNKINETIC ER) 10-8 MG/5ML LQCR   Oral   Take 5 mLs by mouth every 12 (twelve) hours as needed (cough.  no driving when taking this medication.).   140 mL   0   . EXPIRED: ferrous sulfate 325 (65 FE) MG tablet   Oral   Take 1 tablet (325 mg total) by mouth 2 (two) times daily with a meal.   60 tablet   5   . fexofenadine-pseudoephedrine (ALLEGRA-D) 60-120 MG per tablet   Oral   Take 1 tablet by mouth every 12 (twelve) hours.   30 tablet   1   . fluticasone (FLONASE) 50 MCG/ACT nasal spray   Nasal   Place 2 sprays into the nose daily.   16 g   1   . OVER THE COUNTER MEDICATION   Oral   Take 1 tablet by mouth once. Pt states that she took one laxative ([facility] brand), she did not know what the generic name was.          . prenatal vitamin w/FE,  FA (PRENATAL 1 + 1) 27-1 MG TABS   Oral   Take 1 tablet by mouth daily.            BP 106/66  Pulse 100  Temp(Src) 98.2 F (36.8 C) (Oral)  Resp 18  SpO2 99%  LMP 12/15/2012  Breastfeeding? No Physical Exam  Constitutional: She is oriented to person, place, and time. She appears well-developed and well-nourished.  HENT:  Head: Normocephalic and atraumatic.  Mouth/Throat: Oropharynx is clear and moist.  Eyes: Pupils are equal, round, and reactive to light.  Cardiovascular: Normal rate and regular rhythm.   Pulmonary/Chest: Effort normal and breath sounds normal.  Abdominal: Soft.  Musculoskeletal: Normal range of motion.  Neurological: She is alert and oriented to person, place, and time.  Skin: Skin is warm and dry.  Psychiatric: She has a normal mood and affect.    ED Course   Procedures (including critical care time)  Labs Reviewed  POCT PREGNANCY, URINE   No  results found. 1. Allergic rhinitis     MDM  Patient given medications listed below.  If not better within the next few days she will return for recheck.  Instructions given and she voices understanding.    Meds ordered this encounter  Medications  . chlorpheniramine-HYDROcodone (TUSSIONEX PENNKINETIC ER) 10-8 MG/5ML LQCR    Sig: Take 5 mLs by mouth every 12 (twelve) hours as needed (cough.  no driving when taking this medication.).    Dispense:  140 mL    Refill:  0  . fexofenadine-pseudoephedrine (ALLEGRA-D) 60-120 MG per tablet    Sig: Take 1 tablet by mouth every 12 (twelve) hours.    Dispense:  30 tablet    Refill:  1  . fluticasone (FLONASE) 50 MCG/ACT nasal spray    Sig: Place 2 sprays into the nose daily.    Dispense:  16 g    Refill:  1    Zonia Kief, PA-C 12/28/12 1608

## 2012-12-29 NOTE — ED Provider Notes (Signed)
Medical screening examination/treatment/procedure(s) were performed by resident physician or non-physician practitioner and as supervising physician I was immediately available for consultation/collaboration.   Barkley Bruns MD.   Linna Hoff, MD 12/29/12 [phone]

## 2014-03-07 ENCOUNTER — Encounter: Payer: Self-pay | Admitting: Internal Medicine

## 2014-03-07 ENCOUNTER — Ambulatory Visit (INDEPENDENT_AMBULATORY_CARE_PROVIDER_SITE_OTHER): Payer: 59 | Admitting: Internal Medicine

## 2014-03-07 VITALS — BP 118/64 | HR 88 | Temp 98.3°F | Resp 18

## 2014-03-07 DIAGNOSIS — Z0000 Encounter for general adult medical examination without abnormal findings: Secondary | ICD-10-CM

## 2014-03-07 DIAGNOSIS — Z331 Pregnant state, incidental: Secondary | ICD-10-CM

## 2014-03-07 DIAGNOSIS — Z3490 Encounter for supervision of normal pregnancy, unspecified, unspecified trimester: Secondary | ICD-10-CM | POA: Insufficient documentation

## 2014-03-07 NOTE — Assessment & Plan Note (Signed)
Will check lipid panel at next visit. She is getting flu shot later this month with OB/Gyn and up to date on other screening. Advised to increase her exercise level and she will work on weight loss after delivery in February.

## 2014-03-07 NOTE — Progress Notes (Signed)
Pre visit review using our clinic review tool, if applicable. No additional management support is needed unless otherwise documented below in the visit note. 

## 2014-03-07 NOTE — Patient Instructions (Signed)
We will see you back in the next 1-2 years or if you have any problems or questions.   Work on getting back into the exercise a couple times per week to keep yourself healthy and happy. Exercise helps to release dopamine in the brain which is one of the hormones which helps [place] to feel relaxed and happy.  Exercise to Stay Healthy Exercise helps you become and stay healthy. EXERCISE IDEAS AND TIPS Choose exercises that:  You enjoy.  Fit into your day. You do not need to exercise really hard to be healthy. You can do exercises at a slow or medium level and stay healthy. You can:  Stretch before and after working out.  Try yoga, Pilates, or tai chi.  Lift weights.  Walk fast, swim, jog, run, climb stairs, bicycle, dance, or rollerskate.  Take aerobic classes. Exercises that burn about 150 calories:  Running 1  miles in 15 minutes.  Playing volleyball for 45 to 60 minutes.  Washing and waxing a car for 45 to 60 minutes.  Playing touch football for 45 minutes.  Walking 1  miles in 35 minutes.  Pushing a stroller 1  miles in 30 minutes.  Playing basketball for 30 minutes.  Raking leaves for 30 minutes.  Bicycling 5 miles in 30 minutes.  Walking 2 miles in 30 minutes.  Dancing for 30 minutes.  Shoveling snow for 15 minutes.  Swimming laps for 20 minutes.  Walking up stairs for 15 minutes.  Bicycling 4 miles in 15 minutes.  Gardening for 30 to 45 minutes.  Jumping rope for 15 minutes.  Washing windows or floors for 45 to 60 minutes. Document Released: 06/12/2010 Document Revised: 08/02/2011 Document Reviewed: 06/12/2010 [facility] Patient Information 2015 [place], [place]. This information is not intended to replace advice given to you by your health care provider. Make sure you discuss any questions you have with your health care provider.

## 2014-03-07 NOTE — Assessment & Plan Note (Signed)
Patient currently 5 months pregnant and following with her OB/Gyn for that. She is not having any pregnancy related problems, BP normal. She does have history of post dates with her last pregnancy.

## 2014-03-07 NOTE — Progress Notes (Signed)
   Subjective:    Patient ID: Joseph BerkshireElizabeth Weninger, female    DOB: 08/19/1980, 33 y.o.   MRN: [id number]  HPI The patient is a 33 YO female who is coming in today to establish care. She is currently 5 months pregnant with her second child. 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 She has not been able to lose the baby weight since last child (about 3 years ago) and has not been exercising like she did as she has less time for herself. They do try to walk locally and she occasionally bikes to work. She has had to stop that as she is now 5 months pregnant. She is not having any complaints at this time. She has been treated for UTI with OB/Gyn and they will follow up with repeat culture at next visit.  Review of Systems  Constitutional: Negative for fever, activity change, appetite change, fatigue and unexpected weight change.  HENT: Negative.   Eyes: Negative.   Respiratory: Negative for cough, chest tightness, shortness of breath and wheezing.   Cardiovascular: Negative for chest pain, palpitations and leg swelling.  Gastrointestinal: Negative for abdominal pain, diarrhea, constipation and abdominal distention.  Genitourinary: Positive for frequency.  Musculoskeletal: Negative for arthralgias, gait problem and myalgias.  Skin: Negative.   Neurological: Negative for dizziness, weakness and headaches.      Objective:   Physical Exam  Constitutional: She is oriented to person, place, and time. She appears well-developed and well-nourished. No distress.  HENT:  Head: Normocephalic and atraumatic.  Eyes: EOM are normal.  Neck: Normal range of motion.  Cardiovascular: Normal rate and regular rhythm.   No murmur heard. Pulmonary/Chest: Effort normal and breath sounds normal. No respiratory distress. She has no wheezes. She has no rales.  Abdominal: Soft. Bowel sounds are normal. She exhibits no distension. There is no tenderness. There is no rebound.  5 months pregnant  Musculoskeletal:  Normal range of motion. She exhibits no edema and no tenderness.  Neurological: She is alert and oriented to person, place, and time. Coordination normal.  Skin: Skin is warm and dry.   Filed Vitals:   03/07/14 1309  BP: 118/64  Pulse: 88  Temp: 98.3 F (36.8 C)  TempSrc: Oral  Resp: 18  SpO2: 97%       Assessment & Plan:

## 2014-03-25 ENCOUNTER — Encounter: Payer: Self-pay | Admitting: Internal Medicine

## 2014-05-24 NOTE — L&D Delivery Note (Signed)
Delivery Note Pt progressed rapidly to complete dilation and pushed well.  At 8:08 PM a healthy female was delivered via Vaginal, Spontaneous Delivery (Presentation: Left Occiput Anterior).  APGAR:8 ,9 ; weight pending .   Placenta status: Intact, Spontaneous.  Cord: 3 vessels with the following complications: cord around arm.    Anesthesia: Epidural  Episiotomy: None Lacerations: 1st degree;Perineal Suture Repair: 3.0 vicryl rapide Est. Blood Loss (mL):  300cc  Mom to postpartum.  Baby to Couplet care / Skin to Skin.  Oliver PilaRICHARDSON,Macen Joslin W 06/27/2014, 8:27 PM

## 2014-06-27 ENCOUNTER — Inpatient Hospital Stay (HOSPITAL_COMMUNITY): Payer: 59 | Admitting: Anesthesiology

## 2014-06-27 ENCOUNTER — Encounter (HOSPITAL_COMMUNITY): Payer: Self-pay | Admitting: *Deleted

## 2014-06-27 ENCOUNTER — Inpatient Hospital Stay (HOSPITAL_COMMUNITY)
Admission: AD | Admit: 2014-06-27 | Discharge: 2014-06-29 | DRG: 775 | Disposition: A | Discharge status: Home / Self Care | Payer: 59 | Source: Ambulatory Visit | Attending: Obstetrics and Gynecology | Admitting: Obstetrics and Gynecology

## 2014-06-27 DIAGNOSIS — Z823 Family history of stroke: Secondary | ICD-10-CM | POA: Diagnosis not present

## 2014-06-27 DIAGNOSIS — G930 Cerebral cysts: Secondary | ICD-10-CM | POA: Diagnosis present

## 2014-06-27 DIAGNOSIS — Z6838 Body mass index (BMI) 38.0-38.9, adult: Secondary | ICD-10-CM

## 2014-06-27 DIAGNOSIS — Z87440 Personal history of urinary (tract) infections: Secondary | ICD-10-CM | POA: Diagnosis not present

## 2014-06-27 DIAGNOSIS — K219 Gastro-esophageal reflux disease without esophagitis: Secondary | ICD-10-CM | POA: Diagnosis present

## 2014-06-27 DIAGNOSIS — Z8249 Family history of ischemic heart disease and other diseases of the circulatory system: Secondary | ICD-10-CM | POA: Diagnosis not present

## 2014-06-27 DIAGNOSIS — Z3A36 36 weeks gestation of pregnancy: Secondary | ICD-10-CM | POA: Diagnosis present

## 2014-06-27 DIAGNOSIS — Z3490 Encounter for supervision of normal pregnancy, unspecified, unspecified trimester: Secondary | ICD-10-CM

## 2014-06-27 DIAGNOSIS — O9962 Diseases of the digestive system complicating childbirth: Secondary | ICD-10-CM | POA: Diagnosis present

## 2014-06-27 DIAGNOSIS — Z833 Family history of diabetes mellitus: Secondary | ICD-10-CM | POA: Diagnosis not present

## 2014-06-27 DIAGNOSIS — O99214 Obesity complicating childbirth: Secondary | ICD-10-CM | POA: Diagnosis present

## 2014-06-27 DIAGNOSIS — Z0000 Encounter for general adult medical examination without abnormal findings: Secondary | ICD-10-CM

## 2014-06-27 LAB — CBC
HCT: 37.8 % (ref 36.0–46.0)
HEMOGLOBIN: 12.6 g/dL (ref 12.0–15.0)
MCH: 29.1 pg (ref 26.0–34.0)
MCHC: 33.3 g/dL (ref 30.0–36.0)
MCV: 87.3 fL (ref 78.0–100.0)
Platelets: 240 10*3/uL (ref 150–400)
RBC: 4.33 MIL/uL (ref 3.87–5.11)
RDW: 14.1 % (ref 11.5–15.5)
WBC: 10.1 10*3/uL (ref 4.0–10.5)

## 2014-06-27 LAB — TYPE AND SCREEN
ABO/RH(D): O POS
ANTIBODY SCREEN: NEGATIVE

## 2014-06-27 LAB — ABO/RH: ABO/RH(D): O POS

## 2014-06-27 MED ORDER — EPHEDRINE 5 MG/ML INJ
10.0000 mg | INTRAVENOUS | Status: DC | PRN
  Filled 2014-06-27: qty 2

## 2014-06-27 MED ORDER — OXYCODONE-ACETAMINOPHEN 5-325 MG PO TABS: 1.0000 | ORAL_TABLET | ORAL | Status: DC | PRN

## 2014-06-27 MED ORDER — PENICILLIN G POTASSIUM 5000000 UNITS IJ SOLR
2.5000 10*6.[IU] | INTRAVENOUS | Status: DC
  Administered 2014-06-27: 2.5 10*6.[IU] via INTRAVENOUS
  Filled 2014-06-27 (×4): qty 2.5

## 2014-06-27 MED ORDER — SENNOSIDES-DOCUSATE SODIUM 8.6-50 MG PO TABS
2.0000 | ORAL_TABLET | ORAL | Status: DC
  Administered 2014-06-28 (×2): 2 via ORAL
  Filled 2014-06-27 (×2): qty 2

## 2014-06-27 MED ORDER — FLEET ENEMA 7-19 GM/118ML RE ENEM: 1.0000 | ENEMA | RECTAL | Status: DC | PRN

## 2014-06-27 MED ORDER — PHENYLEPHRINE 40 MCG/ML (10ML) SYRINGE FOR IV PUSH (FOR BLOOD PRESSURE SUPPORT)
80.0000 ug | PREFILLED_SYRINGE | INTRAVENOUS | Status: DC | PRN
  Filled 2014-06-27: qty 2
  Filled 2014-06-27: qty 20

## 2014-06-27 MED ORDER — OXYCODONE-ACETAMINOPHEN 5-325 MG PO TABS: 2.0000 | ORAL_TABLET | ORAL | Status: DC | PRN

## 2014-06-27 MED ORDER — DIPHENHYDRAMINE HCL 50 MG/ML IJ SOLN: 12.5000 mg | INTRAMUSCULAR | Status: DC | PRN

## 2014-06-27 MED ORDER — FENTANYL 2.5 MCG/ML BUPIVACAINE 1/10 % EPIDURAL INFUSION (WH - ANES)
14.0000 mL/h | INTRAMUSCULAR | Status: DC | PRN
  Filled 2014-06-27: qty 125

## 2014-06-27 MED ORDER — DIBUCAINE 1 % RE OINT: 1.0000 "application " | TOPICAL_OINTMENT | RECTAL | Status: DC | PRN

## 2014-06-27 MED ORDER — LACTATED RINGERS IV SOLN
500.0000 mL | Freq: Once | INTRAVENOUS | Status: AC
  Administered 2014-06-27: 1000 mL via INTRAVENOUS

## 2014-06-27 MED ORDER — FENTANYL 2.5 MCG/ML BUPIVACAINE 1/10 % EPIDURAL INFUSION (WH - ANES)
INTRAMUSCULAR | Status: DC | PRN
  Administered 2014-06-27: 14 mL/h via EPIDURAL

## 2014-06-27 MED ORDER — ONDANSETRON HCL 4 MG PO TABS: 4.0000 mg | ORAL_TABLET | ORAL | Status: DC | PRN

## 2014-06-27 MED ORDER — ONDANSETRON HCL 4 MG/2ML IJ SOLN: 4.0000 mg | Freq: Four times a day (QID) | INTRAMUSCULAR | Status: DC | PRN

## 2014-06-27 MED ORDER — TETANUS-DIPHTH-ACELL PERTUSSIS 5-2.5-18.5 LF-MCG/0.5 IM SUSP: 0.5000 mL | Freq: Once | INTRAMUSCULAR | Status: DC

## 2014-06-27 MED ORDER — DEXTROSE 5 % IV SOLN
5.0000 10*6.[IU] | Freq: Once | INTRAVENOUS | Status: AC
  Administered 2014-06-27: 5 10*6.[IU] via INTRAVENOUS
  Filled 2014-06-27: qty 5

## 2014-06-27 MED ORDER — OXYCODONE-ACETAMINOPHEN 5-325 MG PO TABS
2.0000 | ORAL_TABLET | ORAL | Status: DC | PRN
  Administered 2014-06-28: 2 via ORAL
  Filled 2014-06-27: qty 2

## 2014-06-27 MED ORDER — DIPHENHYDRAMINE HCL 25 MG PO CAPS: 25.0000 mg | ORAL_CAPSULE | Freq: Four times a day (QID) | ORAL | Status: DC | PRN

## 2014-06-27 MED ORDER — ZOLPIDEM TARTRATE 5 MG PO TABS: 5.0000 mg | ORAL_TABLET | Freq: Every evening | ORAL | Status: DC | PRN

## 2014-06-27 MED ORDER — LANOLIN HYDROUS EX OINT: TOPICAL_OINTMENT | CUTANEOUS | Status: DC | PRN

## 2014-06-27 MED ORDER — OXYTOCIN 40 UNITS IN LACTATED RINGERS INFUSION - SIMPLE MED
1.0000 m[IU]/min | INTRAVENOUS | Status: DC
  Administered 2014-06-27: 2 m[IU]/min via INTRAVENOUS
  Filled 2014-06-27: qty 1000

## 2014-06-27 MED ORDER — PHENYLEPHRINE 40 MCG/ML (10ML) SYRINGE FOR IV PUSH (FOR BLOOD PRESSURE SUPPORT)
80.0000 ug | PREFILLED_SYRINGE | INTRAVENOUS | Status: DC | PRN
  Administered 2014-06-27: 80 ug via INTRAVENOUS
  Filled 2014-06-27: qty 2

## 2014-06-27 MED ORDER — ACETAMINOPHEN 325 MG PO TABS: 650.0000 mg | ORAL_TABLET | ORAL | Status: DC | PRN

## 2014-06-27 MED ORDER — OXYTOCIN BOLUS FROM INFUSION
500.0000 mL | INTRAVENOUS | Status: DC
  Administered 2014-06-27: 500 mL via INTRAVENOUS

## 2014-06-27 MED ORDER — LIDOCAINE HCL (PF) 1 % IJ SOLN
INTRAMUSCULAR | Status: DC | PRN
  Administered 2014-06-27 (×2): 8 mL

## 2014-06-27 MED ORDER — LACTATED RINGERS IV SOLN: 500.0000 mL | INTRAVENOUS | Status: DC | PRN

## 2014-06-27 MED ORDER — TERBUTALINE SULFATE 1 MG/ML IJ SOLN
0.2500 mg | Freq: Once | INTRAMUSCULAR | Status: DC | PRN
  Filled 2014-06-27: qty 1

## 2014-06-27 MED ORDER — LACTATED RINGERS IV SOLN
INTRAVENOUS | Status: DC
  Administered 2014-06-27: 19:00:00 via INTRAVENOUS

## 2014-06-27 MED ORDER — ONDANSETRON HCL 4 MG/2ML IJ SOLN
4.0000 mg | INTRAMUSCULAR | Status: DC | PRN
Start: 2014-06-27 — End: 2014-06-29

## 2014-06-27 MED ORDER — FLUTICASONE PROPIONATE 50 MCG/ACT NA SUSP
2.0000 | Freq: Every day | NASAL | Status: DC
  Administered 2014-06-28: 2 via NASAL
  Filled 2014-06-27: qty 16

## 2014-06-27 MED ORDER — OXYTOCIN 40 UNITS IN LACTATED RINGERS INFUSION - SIMPLE MED: 62.5000 mL/h | INTRAVENOUS | Status: DC

## 2014-06-27 MED ORDER — BENZOCAINE-MENTHOL 20-0.5 % EX AERO: 1.0000 "application " | INHALATION_SPRAY | CUTANEOUS | Status: DC | PRN

## 2014-06-27 MED ORDER — CITRIC ACID-SODIUM CITRATE 334-500 MG/5ML PO SOLN: 30.0000 mL | ORAL | Status: DC | PRN

## 2014-06-27 MED ORDER — WITCH HAZEL-GLYCERIN EX PADS: 1.0000 "application " | MEDICATED_PAD | CUTANEOUS | Status: DC | PRN

## 2014-06-27 MED ORDER — IBUPROFEN 600 MG PO TABS
600.0000 mg | ORAL_TABLET | Freq: Four times a day (QID) | ORAL | Status: DC
  Administered 2014-06-28 – 2014-06-29 (×7): 600 mg via ORAL
  Filled 2014-06-27 (×8): qty 1

## 2014-06-27 MED ORDER — PRENATAL MULTIVITAMIN CH
1.0000 | ORAL_TABLET | Freq: Every day | ORAL | Status: DC
  Administered 2014-06-28 – 2014-06-29 (×2): 1 via ORAL
  Filled 2014-06-27 (×2): qty 1

## 2014-06-27 MED ORDER — LORATADINE 10 MG PO TABS
10.0000 mg | ORAL_TABLET | Freq: Every day | ORAL | Status: DC
Start: 2014-06-28 — End: 2014-06-29
  Administered 2014-06-28: 10 mg via ORAL
  Filled 2014-06-27: qty 1

## 2014-06-27 MED ORDER — SIMETHICONE 80 MG PO CHEW: 80.0000 mg | CHEWABLE_TABLET | ORAL | Status: DC | PRN

## 2014-06-27 MED ORDER — LIDOCAINE HCL (PF) 1 % IJ SOLN
30.0000 mL | INTRAMUSCULAR | Status: DC | PRN
  Filled 2014-06-27: qty 30

## 2014-06-27 NOTE — Progress Notes (Signed)
Patient ID: Ashley Best, female   DOB: 07/20/1980, 34 y.o.   MRN: [id number] Pt getting very uncomfortable.   Wants epidural afeb vss FHR  Category 1 Cervix 80/4/-1 Will get epidural.

## 2014-06-27 NOTE — MAU Note (Signed)
Pt reports her water broke about an hour ago. Clear fluid. Denies any ctxs at this time.

## 2014-06-27 NOTE — Anesthesia Procedure Notes (Signed)
Epidural Patient location during procedure: OB Start time: 06/27/2014 6:35 PM End time: 06/27/2014 6:42 PM  Staffing Anesthesiologist: Leilani AbleHATCHETT, Letasha Kershaw  Preanesthetic Checklist Completed: patient identified, surgical consent, pre-op evaluation, timeout performed, IV checked, risks and benefits discussed and monitors and equipment checked  Epidural Patient position: sitting Prep: site prepped and draped and DuraPrep Patient monitoring: continuous pulse ox and blood pressure Approach: midline Location: L3-L4 Injection technique: LOR air  Needle:  Needle type: Tuohy  Needle gauge: 17 G Needle length: 9 cm and 9 Needle insertion depth: 6 cm Catheter type: closed end flexible Catheter size: 19 Gauge Catheter at skin depth: 10 cm Test dose: negative and Other  Assessment Sensory level: T11 Events: blood not aspirated, injection not painful, no injection resistance, negative IV test and no paresthesia

## 2014-06-27 NOTE — H&P (Signed)
Joseph Berkshirelizabeth Hoogland is a 34 y.o. female G2P1001 at 6736 6/7 weeks (EDD 07/19/2014 by LMP c/w 9 week US)  presenting for SROM this AM.  Prenatal care complicated by isolated CP cyst no other anatomical findings.  Also, recurrent UTI's treated with several different medications throughout the pregnancy.  Maternal Medical History:  Reason for admission: Rupture of membranes.   Contractions: Onset was 3-5 hours ago.   Frequency: irregular.   Perceived severity is mild.    Fetal activity: Perceived fetal activity is normal.    Prenatal Complications - Diabetes: none.    OB History    Gravida Para Term Preterm AB TAB SAB Ectopic Multiple Living   2 1 1       1     2012 NSVD 9#6oz  Past Medical History  Diagnosis Date  . No pertinent past medical history   . GERD (gastroesophageal reflux disease)    Past Surgical History  Procedure Laterality Date  . Wisdom tooth extraction    . Root canal     Family History: family history includes Cancer in her paternal grandmother; Diabetes in her father; Early death in her maternal grandfather; Heart attack in her paternal grandfather and paternal uncle; Hypertension in her father and paternal grandfather; Stroke in her maternal grandfather. Social History:  reports that she has never smoked. She does not have any smokeless tobacco history on file. She reports that she does not drink alcohol or use illicit drugs.   Prenatal Transfer Tool  Maternal Diabetes: No Genetic Screening: Declined Maternal Ultrasounds/Referrals: Normal  Except isolated choroid plexus cyst Fetal Ultrasounds or other Referrals:  None Maternal Substance Abuse:  No Significant Maternal Medications:  None Significant Maternal Lab Results:  Lab values include: Other:   GBS unknown Other Comments:  None  ROS  Dilation: 2 Effacement (%): 50 Station: -2 Exam by:: Keene Gilkey Blood pressure 120/70, pulse 98, temperature 98 F (36.7 C), temperature source Oral, resp. rate 18,  height 5\' 7"  (1.702 m), weight 110.678 kg (244 lb). Exam Physical Exam  Constitutional: She is oriented to person, place, and time. She appears well-developed and well-nourished.  Cardiovascular: Normal rate and regular rhythm.   Respiratory: Effort normal.  GI: Soft.  Genitourinary: Vagina normal.  Neurological: She is alert and oriented to person, place, and time.  Psychiatric: She has a normal mood and affect.    Prenatal labs: ABO, Rh:  O positive Antibody:  neg Rubella:  Immune RPR:   NR HBsAg:   Neg HIV:   Neg GBS:   unknown--preterm and will treat One hour GTT 113 Declined genetic screens  Assessment/Plan: Pt admitted with SROM and slightly preterm.  GBS was sent from office, but specimen not able to be run so status unknown.  Will treat since preterm. Pitocin to augment labor, epidural prn.  Oliver PilaICHARDSON,Tatumn Corbridge W 06/27/2014, 1:52 PM

## 2014-06-27 NOTE — Anesthesia Preprocedure Evaluation (Signed)
Anesthesia Evaluation  Patient identified by MRN, date of birth, ID band Patient awake    Reviewed: Allergy & Precautions, H&P , NPO status , Patient's Chart, lab work & pertinent test results  Airway Mallampati: I  TM Distance: >3 FB Neck ROM: full    Dental no notable dental hx.    Pulmonary neg pulmonary ROS,    Pulmonary exam normal       Cardiovascular negative cardio ROS      Neuro/Psych negative neurological ROS  negative psych ROS   GI/Hepatic negative GI ROS, Neg liver ROS,   Endo/Other  Morbid obesity  Renal/GU negative Renal ROS     Musculoskeletal   Abdominal (+) + obese,   Peds  Hematology negative hematology ROS (+)   Anesthesia Other Findings   Reproductive/Obstetrics (+) Pregnancy                             Anesthesia Physical Anesthesia Plan  ASA: III  Anesthesia Plan: Epidural   Post-op Pain Management:    Induction:   Airway Management Planned:   Additional Equipment:   Intra-op Plan:   Post-operative Plan:   Informed Consent: I have reviewed the patients History and Physical, chart, labs and discussed the procedure including the risks, benefits and alternatives for the proposed anesthesia with the patient or authorized representative who has indicated his/her understanding and acceptance.     Plan Discussed with:   Anesthesia Plan Comments:         Anesthesia Quick Evaluation

## 2014-06-28 LAB — CBC
HCT: 33.9 % — ABNORMAL LOW (ref 36.0–46.0)
HEMOGLOBIN: 11.4 g/dL — AB (ref 12.0–15.0)
MCH: 29.3 pg (ref 26.0–34.0)
MCHC: 33.6 g/dL (ref 30.0–36.0)
MCV: 87.1 fL (ref 78.0–100.0)
PLATELETS: 209 10*3/uL (ref 150–400)
RBC: 3.89 MIL/uL (ref 3.87–5.11)
RDW: 14.2 % (ref 11.5–15.5)
WBC: 12.6 10*3/uL — ABNORMAL HIGH (ref 4.0–10.5)

## 2014-06-28 LAB — RPR: RPR Ser Ql: NONREACTIVE

## 2014-06-28 MED ORDER — RHO D IMMUNE GLOBULIN 1500 UNIT/2ML IJ SOSY
300.0000 ug | PREFILLED_SYRINGE | Freq: Once | INTRAMUSCULAR | Status: DC
  Filled 2014-06-28: qty 2

## 2014-06-28 NOTE — Anesthesia Postprocedure Evaluation (Signed)
Anesthesia Post Note  Patient: Ashley Best  Procedure(s) Performed: * No procedures listed *  Anesthesia type: Epidural  Patient location: Mother/Baby  Post pain: Pain level controlled  Post assessment: Post-op Vital signs reviewed  Last Vitals:  Filed Vitals:   06/28/14 0430  BP: 100/52  Pulse: 75  Temp: 36.5 C  Resp:     Post vital signs: Reviewed  Level of consciousness:alert  Complications: No apparent anesthesia complications

## 2014-06-28 NOTE — Lactation Note (Signed)
This note was copied from the chart of Ashley Joseph BerkshireElizabeth Farruggia. Lactation Consultation Note Mom BF her 1st. Child for 3 months and had to supplement d/t low milk supply. Baby is LPI 36 6/7 weeks. Has been spity per mom. BF well once during the night. LPI information sheet given and explained the importance of pumping. Noted mom to have tubular breast w/gap between breast. Denies PCOS. Hand expression taught w/noted colostrum.  Patient Name: Ashley Best   Mom shown how to use DEBP & how to disassemble, clean, & reassemble parts.Mom knows to pump q3h for 15-20 min.Mom reports + breast changes w/pregnancy. Encouraged to call for assistance if needed and to verify proper latch. Educated about  LPI newborn behavior. Mom encouraged to feed baby 8-12 times/24 hours and with feeding cues. Referred to Baby and Me Book in Breastfeeding section Pg. 22-23 for position options and Proper latch demonstration.Mom encouraged to do skin-to-skin.Mom encouraged to waken baby for feeds. Mom doesn't have a pump. She is going to call insurance co. Today to see if they offer one.  Today's Date: 06/28/2014 Reason for consult: Initial assessment   Maternal Data Has patient been taught Hand Expression?: Yes Does the patient have breastfeeding experience prior to this delivery?: Yes  Feeding    LATCH Score/Interventions Latch: Too sleepy or reluctant, no latch achieved, no sucking elicited.                    Lactation Tools Discussed/Used Tools: Pump Breast pump type: Double-Electric Breast Pump Pump Review: Setup, frequency, and cleaning;Milk Storage Initiated by:: Peri JeffersonL. Caydance Kuehnle RN Date initiated:: 06/28/14   Consult Status Consult Status: Follow-up Date: 06/28/14 Follow-up type: In-patient    Charyl DancerCARVER, [name] 06/28/2014, 6:58 AM

## 2014-06-28 NOTE — Lactation Note (Signed)
This note was copied from the chart of Ashley Best. Lactation Consultation Note; I observed the rest of the feeding. When baby had fed for 15 min mom reports lots of back pain and cramping and wants to take baby off the breast. After diaper change baby off to sleep in bassinet. Mom wants to lay down and rest Encouraged to pump after feedings to promote milk supply. Mom asks if she can pump later. Does not want to ask for pain med. To call for assist prn Patient Name: Ashley Joseph Berkshirelizabeth Hawn WUJWJ'XToday's Date: 06/28/2014 Reason for consult: Late preterm infant   Maternal Data Has patient been taught Hand Expression?: Yes  Feeding Feeding Type: Breast Milk Length of feed: 15 min  LATCH Score/Interventions Latch: Grasps breast easily, tongue down, lips flanged, rhythmical sucking.  Audible Swallowing: Spontaneous and intermittent  Type of Nipple: Everted at rest and after stimulation  Comfort (Breast/Nipple): Soft / non-tender     Hold (Positioning): No assistance needed to correctly position infant at breast.  LATCH Score: 10  Lactation Tools Discussed/Used     Consult Status      Pamelia HoitWeeks, Deepika Decatur D 06/28/2014, 1:18 PM

## 2014-06-28 NOTE — Progress Notes (Signed)
Post Partum Day 1 Subjective: no complaints and tolerating PO, baby doing well  Objective: Blood pressure 100/52, pulse 75, temperature 97.7 F (36.5 C), temperature source Oral, resp. rate 18, height 5\' 7"  (1.702 m), weight 110.678 kg (244 lb), SpO2 98 %, unknown if currently breastfeeding.  Physical Exam:  General: alert and cooperative Lochia: appropriate Uterine Fundus: firm   Recent Labs  06/27/14 1146 06/28/14 0550  HGB 12.6 11.4*  HCT 37.8 33.9*    Assessment/Plan: Plan for discharge tomorrow   LOS: 1 day   Ashley Best W 06/28/2014, 7:14 AM

## 2014-06-29 MED ORDER — IBUPROFEN 600 MG PO TABS: 600.0000 mg | ORAL_TABLET | Freq: Four times a day (QID) | ORAL | Status: DC | PRN

## 2014-06-29 MED ORDER — OXYCODONE-ACETAMINOPHEN 5-325 MG PO TABS: 1.0000 | ORAL_TABLET | Freq: Four times a day (QID) | ORAL | Status: DC | PRN

## 2014-06-29 NOTE — Discharge Summary (Signed)
NAMConley Rolls:  Best, Ashley             ACCOUNT NO.:  [id number]  MEDICAL RECORD NO.:  [id number]  LOCATION:  9115                          FACILITY:  [facility]  PHYSICIAN:  Malachi Prohomas F. Ambrose MantleHenley, M.D. DATE OF BIRTH:  06-18-1980  DATE OF ADMISSION:  06/27/2014 DATE OF DISCHARGE:  06/29/2014                              DISCHARGE SUMMARY   HISTORY OF PRESENT ILLNESS:  This is a 34 year old white female, para 1- 0-0-1, gravida 2 at 36 weeks and 6 days[facility].  The patient's prenatal course complicated by recurrent UTIs.  After admission to the hospital, the patient began having regular contractions.  At 6:22 p.m., she was 4 cm.  She received an epidural, progressed rapidly to complete dilatation and pushed well, and at 8:08 p.m. a healthy female infant was delivered by Dr. Senaida Oresichardson, LOA.  There was a first-degree perineal laceration repaired with 3-0 Vicryl Rapide.  Postpartum, the patient did very well and was discharged on the second postpartum day.  Initial hemoglobin 12.6, hematocrit 37.8, white count 10,100, platelet count 240,000, followup hemoglobin was 11.4.  FINAL DIAGNOSIS:  Intrauterine pregnancy, 36 weeks and 6 days delivered. LOA.  OPERATION:  Spontaneous delivery LOA, repair of first-degree perineal laceration.  FINAL CONDITION:  Improved.  INSTRUCTIONS:  Include our regular discharge instruction booklet as well as the after visit summary.  Prescriptions for Percocet 5/325, 15 tablets, 1 every 6 hours as needed for pain and Motrin 600 mg, 15 tablets 1 every 6 hours as needed for pain and 1 refill.  The patient is advised to continue the medication she was on at home prior to delivery.     Malachi Prohomas F. Ambrose MantleHenley, M.D.     TFH/MEDQ  D:  06/29/2014  T:  06/29/2014  Job:  [id number]

## 2014-06-29 NOTE — Discharge Instructions (Signed)
booklet °

## 2014-06-29 NOTE — Lactation Note (Addendum)
This note was copied from the chart of Ashley Best. Lactation Consultation Note  Patient Name: Ashley Joseph Berkshirelizabeth Gainer WUJWJ'XToday's Date: 06/29/2014 Reason for consult: Follow-up assessment;Late preterm infant  Baby is 3939 hours old and is at 5 % weight loss , voids and stools adequate for D/C. Per mom baby was cluster feeding during the night so after after breast feeding , added  supplementing with some formula. LC reviewed potential feeding behaviors , nutritive vs non- nutritive feedings, to watch for hanging  out at the breast, sluggish feeding patterns.  LC reviewed steps for latching and recommended prior to latch to check diaper , change if needed,  And feed the baby skin to skin, latch with depth , and listen for swallows. Average feeding 15 -20 mins on one side , may have some feedings at 10 mins.  Sore nipple and engorgement prevention and tx. Reviewed .  Per mom has a manual pump at home and plans to call the insurance company on Monday to order her DEBP . LC offered mom the rental pump for 2 weeks, and mom declined at this time. LC stressed if in the event her milk is not in at 4-5 days  Or the milk comes in and is having challenges with engorgement to call and come rent a DEBP to protect establishing milk supply. [facility] and  Mom declined. ( due to Baby  being a 7336 6/[redacted] weeks gestation )  Mother informed of post-discharge support and given phone number to the lactation department, including services for phone call assistance; out-patient appointments; and breastfeeding support group. List of other breastfeeding resources in the community given in the handout. Encouraged mother to call for problems or concerns related to breastfeeding.  Maternal Data    Feeding Feeding Type:  (per mom baby recently breast feed and supplemented with a cup ) Length of feed: 15 min  LATCH Score/Interventions                       Lactation Tools Discussed/Used Tools:  (per mom has a manual pump at home. see LC note ) WIC Program: No   Consult Status Consult Status: Complete Date: 06/29/14    Kathrin Greathouseorio, Aodhan Scheidt Ann 06/29/2014, 12:06 PM

## 2014-06-29 NOTE — Progress Notes (Signed)
Patient ID: Ashley BerkshireElizabeth Delmonico, female   DOB: 04/17/1981, 34 y.o.   MRN: [id number] #2 afebrile BP normal for d/c

## 2014-10-23 ENCOUNTER — Encounter: Payer: Self-pay | Admitting: Internal Medicine

## 2014-10-23 ENCOUNTER — Ambulatory Visit (INDEPENDENT_AMBULATORY_CARE_PROVIDER_SITE_OTHER): Payer: 59 | Admitting: Internal Medicine

## 2014-10-23 VITALS — BP 124/78 | HR 90 | Temp 97.6°F | Resp 15 | Wt 218.1 lb

## 2014-10-23 DIAGNOSIS — K2100 Gastro-esophageal reflux disease with esophagitis, without bleeding: Secondary | ICD-10-CM

## 2014-10-23 MED ORDER — SUCRALFATE 1 G PO TABS: 1.0000 g | ORAL_TABLET | Freq: Three times a day (TID) | ORAL | Status: DC

## 2014-10-23 NOTE — Progress Notes (Signed)
   Subjective:    Patient ID: Joseph BerkshireElizabeth Corrow, female    DOB: 08/03/1980, 34 y.o.   MRN: [id number]  HPI  She has had 4 episodes of abdominal discomfort since mid March. This began one & one half months postpartum. She describes cramping in the epigastric area. She had been increasing coffee intake due to deadlines on papers required for school. She would experience relief with vomiting. The symptoms would last up to 30 minutes or more. The last 2 episodes were 5/27 and 5/28. With the last episode she did have some chills. She does drink one cup of coffee per day and eats chocolate. She's also been taking ibuprofen. She will drink up to 64 ounces per day.  She's had some constipation; taking Colace results in a bowel movement every 3-4 days. Yesterday she took magnesium citrate.  She has no other GI symptoms.  She is lactating her 123 and half-month-old.  There is no personal family history of ulcers, colitis, colon polyps, or colon cancer.    Review of Systems Unexplained weight loss,  significant dyspepsia, dysphagia, melena, rectal bleeding, or persistently small caliber stools are denied.  There is no significant cough, sputum production, wheezing,or  paroxysmal nocturnal dyspnea.  Chest pain, palpitations, tachycardia, exertional dyspnea,  claudication or edema are absent.      Objective:   Physical Exam   General appearance :adequately nourished; in no distress.  Eyes: No conjunctival inflammation or scleral icterus is present.  Oral exam:  Lips and gums are healthy appearing.There is no oropharyngeal erythema or exudate noted. Dental hygiene is good.  Heart: Initial pulse 101; 90 on recheck. Regular rhythm. S1 and S2 normal without gallop, murmur, click, rub or other extra sounds    Lungs:Chest clear to auscultation; no wheezes, rhonchi,rales ,or rubs present.No increased work of breathing.   Abdomen: Protuberant;bowel sounds normal, soft and non-tender without masses,  organomegaly or hernias noted.  No guarding or rebound. No flank tenderness to percussion.  Vascular : all pulses equal ; no bruits present.  Skin:Warm & dry.  Intact without suspicious lesions or rashes ; no tenting or jaundice   Lymphatic: No lymphadenopathy is noted about the head, neck, axilla, or inguinal areas.   Neuro: Strength, tone & DTRs normal.        Assessment & Plan:  #1 GERD with esophagitis  #2 possible hiatal hernia exacerbation by weight gain with pregnancy  #3 breast feeding which limits GERD therapy options.References reviewed; Carafate & dietary intervention safest option.  Plan: See orders/recommendations

## 2014-10-23 NOTE — Progress Notes (Signed)
Pre visit review using our clinic review tool, if applicable. No additional management support is needed unless otherwise documented below in the visit note. 

## 2014-10-23 NOTE — Patient Instructions (Addendum)
Reflux of gastric acid may be asymptomatic as this may occur mainly during sleep.The triggers for reflux  include stress; the "aspirin family" ; alcohol; peppermint; and caffeine (coffee, tea, cola, and chocolate). The aspirin family would include aspirin and the nonsteroidal agents such as ibuprofen &  Naproxen. Tylenol would not cause reflux. If having symptoms ; food & drink should be avoided for @ least 2 hours before going to bed.  Dissolve 1 tablet of Carafate in 5 cc (1 teaspoon) of water. Take before meals and at bedtime if having symptoms.

## 2014-12-12 ENCOUNTER — Emergency Department (HOSPITAL_COMMUNITY)
Admission: EM | Admit: 2014-12-12 | Discharge: 2014-12-12 | Disposition: A | Discharge status: Home / Self Care | Payer: 59 | Attending: Emergency Medicine | Admitting: Emergency Medicine

## 2014-12-12 ENCOUNTER — Encounter (HOSPITAL_COMMUNITY): Payer: Self-pay | Admitting: Emergency Medicine

## 2014-12-12 ENCOUNTER — Emergency Department (HOSPITAL_COMMUNITY): Payer: 59

## 2014-12-12 DIAGNOSIS — K219 Gastro-esophageal reflux disease without esophagitis: Secondary | ICD-10-CM | POA: Diagnosis not present

## 2014-12-12 DIAGNOSIS — R109 Unspecified abdominal pain: Secondary | ICD-10-CM | POA: Diagnosis present

## 2014-12-12 DIAGNOSIS — Z79899 Other long term (current) drug therapy: Secondary | ICD-10-CM | POA: Insufficient documentation

## 2014-12-12 DIAGNOSIS — K8020 Calculus of gallbladder without cholecystitis without obstruction: Secondary | ICD-10-CM

## 2014-12-12 LAB — COMPREHENSIVE METABOLIC PANEL
ALT: 33 U/L (ref 14–54)
AST: 70 U/L — AB (ref 15–41)
Albumin: 4.3 g/dL (ref 3.5–5.0)
Alkaline Phosphatase: 86 U/L (ref 38–126)
Anion gap: 11 (ref 5–15)
BUN: 12 mg/dL (ref 6–20)
CHLORIDE: 105 mmol/L (ref 101–111)
CO2: 25 mmol/L (ref 22–32)
CREATININE: 1.03 mg/dL — AB (ref 0.44–1.00)
Calcium: 9.4 mg/dL (ref 8.9–10.3)
GFR calc non Af Amer: >60 mL/min (ref >60)
GLUCOSE: 115 mg/dL — AB (ref 65–99)
Potassium: 4 mmol/L (ref 3.5–5.1)
Sodium: 141 mmol/L (ref 135–145)
Total Bilirubin: 0.7 mg/dL (ref 0.3–1.2)
Total Protein: 7.4 g/dL (ref 6.5–8.1)

## 2014-12-12 LAB — CBC
HCT: 42.6 % (ref 36.0–46.0)
Hemoglobin: 14.2 g/dL (ref 12.0–15.0)
MCH: 28.9 pg (ref 26.0–34.0)
MCHC: 33.3 g/dL (ref 30.0–36.0)
MCV: 86.8 fL (ref 78.0–100.0)
PLATELETS: 273 10*3/uL (ref 150–400)
RBC: 4.91 MIL/uL (ref 3.87–5.11)
RDW: 12.9 % (ref 11.5–15.5)
WBC: 13.2 10*3/uL — ABNORMAL HIGH (ref 4.0–10.5)

## 2014-12-12 LAB — LIPASE, BLOOD: LIPASE: 32 U/L (ref 22–51)

## 2014-12-12 MED ORDER — ACETAMINOPHEN 325 MG PO TABS
650.0000 mg | ORAL_TABLET | Freq: Once | ORAL | Status: AC
  Administered 2014-12-12: 650 mg via ORAL
  Filled 2014-12-12: qty 2

## 2014-12-12 MED ORDER — SODIUM CHLORIDE 0.9 % IV BOLUS (SEPSIS)
1000.0000 mL | Freq: Once | INTRAVENOUS | Status: AC
  Administered 2014-12-12: 1000 mL via INTRAVENOUS

## 2014-12-12 MED ORDER — ONDANSETRON HCL 4 MG/2ML IJ SOLN
4.0000 mg | Freq: Once | INTRAMUSCULAR | Status: AC
  Administered 2014-12-12: 4 mg via INTRAVENOUS
  Filled 2014-12-12: qty 2

## 2014-12-12 MED ORDER — ONDANSETRON HCL 4 MG PO TABS: 4.0000 mg | ORAL_TABLET | Freq: Four times a day (QID) | ORAL | Status: DC

## 2014-12-12 NOTE — ED Notes (Signed)
Pt transporting to US.

## 2014-12-12 NOTE — ED Notes (Signed)
Pt verbalized understanding of discharge instructions. NAD on departure. A/O x4. Ambulatory with steady gait.

## 2014-12-12 NOTE — ED Notes (Signed)
Pt returned from US

## 2014-12-12 NOTE — Discharge Instructions (Signed)
Cholelithiasis Follow up with gastroenterology. Take tylenol and zofran for pain. Return for fever or worsening abdominal pain.  Cholelithiasis (also called gallstones) is a form of gallbladder disease in which gallstones form in your gallbladder. The gallbladder is an organ that stores bile made in the liver, which helps digest fats. Gallstones begin as small crystals and slowly grow into stones. Gallstone pain occurs when the gallbladder spasms and a gallstone is blocking the duct. Pain can also occur when a stone passes out of the duct.  RISK FACTORS  Being female.   Having multiple pregnancies. Health care providers sometimes advise removing diseased gallbladders before future pregnancies.   Being obese.  Eating a diet heavy in fried foods and fat.   Being older than 60 years and increasing age.   Prolonged use of medicines containing female hormones.   Having diabetes mellitus.   Rapidly losing weight.   Having a family history of gallstones (heredity).  SYMPTOMS  Nausea.   Vomiting.  Abdominal pain.   Yellowing of the skin (jaundice).   Sudden pain. It may persist from several minutes to several hours.  Fever.   Tenderness to the touch. In some cases, when gallstones do not move into the bile duct, people have no pain or symptoms. These are called "silent" gallstones.  TREATMENT Silent gallstones do not need treatment. In severe cases, emergency surgery may be required. Options for treatment include:  Surgery to remove the gallbladder. This is the most common treatment.  Medicines. These do not always work and may take 6-12 months or more to work.  Shock wave treatment (extracorporeal biliary lithotripsy). In this treatment an ultrasound machine sends shock waves to the gallbladder to break gallstones into smaller pieces that can pass into the intestines or be dissolved by medicine. HOME CARE INSTRUCTIONS   Only take over-the-counter or prescription  medicines for pain, discomfort, or fever as directed by your health care provider.   Follow a low-fat diet until seen again by your health care provider. Fat causes the gallbladder to contract, which can result in pain.   Follow up with your health care provider as directed. Attacks are almost always recurrent and surgery is usually required for permanent treatment.  SEEK IMMEDIATE MEDICAL CARE IF:   Your pain increases and is not controlled by medicines.   You have a fever or persistent symptoms for more than 2-3 days.   You have a fever and your symptoms suddenly get worse.   You have persistent nausea and vomiting.  MAKE SURE YOU:   Understand these instructions.  Will watch your condition.  Will get help right away if you are not doing well or get worse. Document Released: 05/06/2005 Document Revised: 01/10/2013 Document Reviewed: 11/01/2012 [facility] Patient Information 2015 [place], [place]. This information is not intended to replace advice given to you by your health care provider. Make sure you discuss any questions you have with your health care provider.

## 2014-12-12 NOTE — ED Provider Notes (Signed)
CSN: [id number]     Arrival date & time 12/12/14  1249 History   First MD Initiated Contact with Patient 12/12/14 1408     Chief Complaint  Patient presents with  . Abdominal Pain    on the left side     (Consider location/radiation/quality/duration/timing/severity/associated sxs/prior Treatment) Patient is a 34 y.o. female presenting with abdominal pain. The history is provided by the patient. No language interpreter was used.  Abdominal Pain Associated symptoms: nausea and vomiting   Associated symptoms: no chest pain, no chills, no constipation, no diarrhea and no fever   Ms. Cecere is a 34 y.o female with a history of GERD who presents for right upper quadrant abdominal pain that began at 10am today.  She states she also had nausea and one episode of vomiting without blood. She states it is worse with inspiration. She ate at 8:30am. She has a similar pain 5 weeks after she delivered her baby but the pain resolved on its own. No treatment prior to arrival.  She is currently breast feeding. She denies any fever, chills, chest pain, shortness of breath, diarrhea, constipation, dysuria, hematuria, urinary frequency, or vaginal discharge. She had a period 5 weeks after delivery of child in February and then had the nexplanon put in.   Past Medical History  Diagnosis Date  . No pertinent past medical history   . GERD (gastroesophageal reflux disease)    Past Surgical History  Procedure Laterality Date  . Wisdom tooth extraction    . Root canal     Family History  Problem Relation Age of Onset  . Hypertension Father   . Diabetes Father   . Heart attack Paternal Uncle   . Stroke Maternal Grandfather   . Early death Maternal Grandfather     MGF anneurysm from war  . Cancer Paternal Grandmother   . Hypertension Paternal Grandfather   . Heart attack Paternal Grandfather    History  Substance Use Topics  . Smoking status: Never Smoker   . Smokeless tobacco: Not on file  . Alcohol  Use: No   OB History    Gravida Para Term Preterm AB TAB SAB Ectopic Multiple Living   2 2 1 1      0 2     Review of Systems  Constitutional: Negative for fever and chills.  Cardiovascular: Negative for chest pain.  Gastrointestinal: Positive for nausea, vomiting and abdominal pain. Negative for diarrhea, constipation and blood in stool.  Genitourinary: Negative for difficulty urinating.  All other systems reviewed and are negative.     Allergies  Review of patient's allergies indicates no known allergies.  Home Medications   Prior to Admission medications   Medication Sig Start Date End Date Taking? Authorizing Provider  acetaminophen (TYLENOL) 500 MG tablet Take 500 mg by mouth every 6 (six) hours as needed for headache.   Yes Historical Provider, MD  sucralfate (CARAFATE) 1 G tablet Take 1 tablet (1 g total) by mouth 4 (four) times daily -  with meals and at bedtime. 10/23/14  Yes Pecola Lawless, MD  ondansetron (ZOFRAN) 4 MG tablet Take 1 tablet (4 mg total) by mouth every 6 (six) hours. 12/12/14   Kc Summerson Patel-Mills, PA-C   BP 103/60 mmHg  Pulse 68  Temp(Src) 97.4 F (36.3 C) (Oral)  Resp 22  SpO2 99% Physical Exam  Constitutional: She is oriented to person, place, and time. She appears well-developed and well-nourished.  HENT:  Head: Normocephalic and atraumatic.  Eyes: Conjunctivae  are normal.  Neck: Normal range of motion. Neck supple.  Cardiovascular: Normal rate, regular rhythm and normal heart sounds.   Pulmonary/Chest: Effort normal and breath sounds normal. No respiratory distress. She has no wheezes. She has no rales.  Abdominal: Soft. She exhibits no distension and no mass. There is tenderness in the right upper quadrant. There is positive Murphy's sign. There is no rigidity, no rebound, no guarding, no CVA tenderness and no tenderness at McBurney's point.  Obese abdomen. Inspiratory arrest upon palpation of RUQ.   Musculoskeletal: Normal range of motion.   Neurological: She is alert and oriented to person, place, and time.  Skin: Skin is warm and dry.  Nursing note and vitals reviewed.   ED Course  Procedures (including critical care time) Labs Review Labs Reviewed  COMPREHENSIVE METABOLIC PANEL - Abnormal; Notable for the following:    Glucose, Bld 115 (*)    Creatinine, Ser 1.03 (*)    AST 70 (*)    All other components within normal limits  CBC - Abnormal; Notable for the following:    WBC 13.2 (*)    All other components within normal limits  LIPASE, BLOOD    Imaging Review US Abdomen Limited  12/12/2014   CLINICAL DATA:  Vomiting and postprandial pain  EXAM: US ABDOMEN LIMITED - RIGHT UPPER QUADRANT  COMPARISON:  None.  FINDINGS: Gallbladder:  Within the gallbladder, there are echogenic foci which move and shadow consistent with gallstones. Largest gallstone measures 1.1 cm in length. There is no gallbladder wall thickening or pericholecystic fluid. No sonographic Murphy sign noted.  Common bile duct:  Diameter: 6 mm. There is no intrahepatic or extrahepatic biliary duct dilatation.  Liver:  No focal lesion identified. Within normal limits in parenchymal echogenicity.  Incidental note is made of slight fullness of the right renal collecting system.  IMPRESSION: Cholelithiasis. Gallbladder otherwise appears unremarkable. Patient is not focally tender over the gallbladder at this time.  Incidental note is made of slight fullness of the right renal collecting system, a finding of questionable etiology and significance.   Electronically Signed   By: Bretta Bang III M.D.   On: 12/12/2014 15:40     EKG Interpretation None      MDM   Final diagnoses:  Calculus of gallbladder without cholecystitis without obstruction  Patient presents for RUQ abdominal pain and vomiting x 1 this am. LFT's and lipase are normal. She is afebrile and well appearing.  She is currently breast feeding and refused narcotic pain medications in the ED or  to go home with. She was unable to give a urine sample but did not have urinary symptoms. Abdominal US shows cholelithiasis without gallbladder wall thickening or fluid. I gave her GI follow up and zofran.  I also gave her return precautions and she verbally agrees with the plan.     Catha Gosselin, PA-C 12/12/14 1609  Rolland Porter, MD 12/21/14 [phone]

## 2014-12-12 NOTE — ED Notes (Signed)
Pt. Stated, after breakfast i started having left side stomach pain , I finally started throwing up  And thought I got rid of everything .  I m having stomach cramping really bad.

## 2014-12-13 ENCOUNTER — Encounter: Payer: Self-pay | Admitting: Nurse Practitioner

## 2014-12-13 ENCOUNTER — Encounter: Payer: Self-pay | Admitting: Gastroenterology

## 2014-12-17 ENCOUNTER — Ambulatory Visit (INDEPENDENT_AMBULATORY_CARE_PROVIDER_SITE_OTHER): Payer: 59 | Admitting: Nurse Practitioner

## 2014-12-17 ENCOUNTER — Encounter: Payer: Self-pay | Admitting: Nurse Practitioner

## 2014-12-17 VITALS — BP 100/70 | HR 88 | Ht 66.25 in | Wt 213.2 lb

## 2014-12-17 DIAGNOSIS — R1010 Upper abdominal pain, unspecified: Secondary | ICD-10-CM

## 2014-12-17 NOTE — Progress Notes (Signed)
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  She presented to the emergency department 5 days ago with right upper quadrant pain, nausea and one episode of vomiting. Liver function studies revealed mildly elevated AST, lipase was normal.  WBC 13. Abdominal ultrasound revealed cholelithiasis without gallbladder wall thickening.   Patient gave birth to a healthy baby in February. In March patient had a sharp epigastric pain radiating over to the left upper quadrant. Episode lasted about 20 minutes, it was nonradiating. Patient drank a lot of water, the pain subsided. In May patient had a recurrent episode of the same pain. This time the pain lasted slightly longer and culminated in vomiting. Patient saw her PCP, Carafate was prescribed. Patient really hasn't needed the Carafate she has felt fine in between episodes. Last Monday patient had a third episode of the same upper abdominal pain after eating pizza. The pain was not as severe as before. She took Carafate and pain subsided. On Thursday patient had yet another episode of this pain, this time after eating pancakes. Her stomach felt full of air. She drank a lot of water as that had helped in the past. Patient ultimately started vomiting and this is when she went to the emergency department. No significant NSAID use.   No recurrent episodes of pain or vomiting since emergency room department visit last Thursday. Patient now on a low fat diet.   Past Medical History  Diagnosis Date  . GERD (gastroesophageal reflux disease)   . Gallstones     Family History  Problem Relation Age of Onset  . Hypertension Father   . Diabetes Father   . Heart attack Paternal Uncle   . Stroke Maternal Grandfather   . Aneurysm Maternal Grandfather     from war, early death  . Breast cancer Paternal Grandmother   . Hypertension Paternal Grandfather   . Heart attack Paternal Grandfather    History  Substance Use Topics  . Smoking  status: Never Smoker   . Smokeless tobacco: Never Used  . Alcohol Use: No   Current Outpatient Prescriptions  Medication Sig Dispense Refill  . acetaminophen (TYLENOL) 500 MG tablet Take 500 mg by mouth every 6 (six) hours as needed for headache.    . etonogestrel (NEXPLANON) 68 MG IMPL implant 1 each by Subdermal route once. Every 3 years     No current facility-administered medications for this visit.   No Known Allergies   Review of Systems: All systems reviewed and negative except where noted in HPI.    US Abdomen Limited  12/12/2014   CLINICAL DATA:  Vomiting and postprandial pain  EXAM: US ABDOMEN LIMITED - RIGHT UPPER QUADRANT  COMPARISON:  None.  FINDINGS: Gallbladder:  Within the gallbladder, there are echogenic foci which move and shadow consistent with gallstones. Largest gallstone measures 1.1 cm in length. There is no gallbladder wall thickening or pericholecystic fluid. No sonographic Murphy sign noted.  Common bile duct:  Diameter: 6 mm. There is no intrahepatic or extrahepatic biliary duct dilatation.  Liver:  No focal lesion identified. Within normal limits in parenchymal echogenicity.  Incidental note is made of slight fullness of the right renal collecting system.  IMPRESSION: Cholelithiasis. Gallbladder otherwise appears unremarkable. Patient is not focally tender over the gallbladder at this time.  Incidental note is made of slight fullness of the right renal collecting system, a finding of questionable etiology and significance.   Electronically Signed   By: Chrissie Noa  Margarita Grizzle III M.D.   On: 12/12/2014 15:40    Physical Exam: BP 100/70 mmHg  Pulse 88  Ht 5' 6.25" (1.683 m)  Wt 213 lb 4 oz (96.73 kg)  BMI 34.15 kg/m2  Breastfeeding? Yes Constitutional: Pleasant, obese white female in no acute distress. HEENT: Normocephalic and atraumatic. Conjunctivae are normal. No scleral icterus. Neck supple.  Cardiovascular: Normal rate, regular rhythm.  Pulmonary/chest:  Effort normal and breath sounds normal. No wheezing, rales or rhonchi. Abdominal: Soft, nondistended, nontender. Bowel sounds active throughout. There are no masses palpable. No hepatomegaly. Extremities: no edema Lymphadenopathy: No cervical adenopathy noted. Neurological: Alert and oriented to person place and time. Skin: Skin is warm and dry. No rashes noted. Psychiatric: Normal mood and affect. Behavior is normal.   ASSESSMENT AND PLAN:  61. 34 year old female with episodic epigastric/left upper quadrant pain since March. Fine in between episodes. Recent ED visit with ultrasound revealing cholelithiasis. Lipase normal. Liver function studies normal except for AST of 70.   Etiology of episodic pain not clear. Doesn't seem like peptic ulcer disease. Wonder about symptomatic cholelithiasis though LUQ involvement a little confusing . Patient is feeling better on a low-fat diet, she is interested in a watch and wait approach. I advised Pepcid 20 mg once daily and a 4-6 weeks follow up. Patient knows to call sooner if she has recurrent pain in the interim. She may need EGD and or surgical referral is episodes reoccur.  2. Five months postpartum. Patient is breastfeeding  3. Obesity   Cc: Genella Mech, MD

## 2014-12-17 NOTE — Patient Instructions (Addendum)
Begin pepcid 20 mg daily in the morning. Call if you are getting worse. Continue low fat diet Follow up with Dr Marina Goodell on  01/29/15 at 2:15 pm

## 2014-12-19 DIAGNOSIS — R1010 Upper abdominal pain, unspecified: Secondary | ICD-10-CM | POA: Insufficient documentation

## 2014-12-19 NOTE — Progress Notes (Signed)
Ashley Best, I reviewed your excellent note and data. I believe that her recent acute pain is very likely related to symptomatic cholelithiasis. I recommend surgical opinion now. Please arrange. Other plans as outlined. Thanks . JP

## 2014-12-24 ENCOUNTER — Telehealth: Payer: Self-pay

## 2014-12-24 NOTE — Telephone Encounter (Signed)
-----   Message from Meredith Pel, NP sent at 12/23/2014  3:23 PM EDT ----- Ashley Best,   Dr. Marina Goodell agrees that patient probably has symptomatic cholelithiasis. He would like for her to go ahead and see the surgeon rather than wait to see if she has anymore attacks. Would you please call patient and arrange for a surgical evaluation. Thanks

## 2014-12-24 NOTE — Telephone Encounter (Signed)
Referral faxed to [facility] Left message for patient to call back  

## 2014-12-25 ENCOUNTER — Ambulatory Visit: Payer: 59 | Admitting: Nurse Practitioner

## 2014-12-25 NOTE — Telephone Encounter (Signed)
Patient notified She is notified of the appt with [facility] for 01/09/15 arrive at 9:00 for a 9:30

## 2015-01-15 ENCOUNTER — Telehealth: Payer: Self-pay | Admitting: Nurse Practitioner

## 2015-01-15 NOTE — Telephone Encounter (Signed)
Patient is upset that she went for a surgical referral and they will not take her insurance.  She is upset and feels I should have known who and who won't take her insurance.  I apologized for the situation.  I recommended she contact her insurance company and find out who is covered under her plan.  She is advised that she requires a referral from her primary care and would need a referral from her primary care. I apologized again.

## 2015-01-29 ENCOUNTER — Ambulatory Visit (INDEPENDENT_AMBULATORY_CARE_PROVIDER_SITE_OTHER): Payer: 59 | Admitting: Internal Medicine

## 2015-01-29 ENCOUNTER — Encounter: Payer: Self-pay | Admitting: Internal Medicine

## 2015-01-29 VITALS — BP 108/58 | HR 76 | Ht 67.0 in | Wt 205.4 lb

## 2015-01-29 DIAGNOSIS — R7989 Other specified abnormal findings of blood chemistry: Secondary | ICD-10-CM

## 2015-01-29 DIAGNOSIS — R1010 Upper abdominal pain, unspecified: Secondary | ICD-10-CM | POA: Diagnosis not present

## 2015-01-29 DIAGNOSIS — K8000 Calculus of gallbladder with acute cholecystitis without obstruction: Secondary | ICD-10-CM

## 2015-01-29 DIAGNOSIS — R945 Abnormal results of liver function studies: Secondary | ICD-10-CM

## 2015-01-29 NOTE — Patient Instructions (Addendum)
Follow up as needed

## 2015-01-29 NOTE — Progress Notes (Signed)
HISTORY OF PRESENT ILLNESS:  Ashley Best is a 34 y.o. female who was initially evaluated in this office 12/17/2014 after being referred by the hospital emergency room physician regarding abrupt onset right upper quadrant pain associated with nausea and vomiting. Workup at that time revealed mild elevation of ALT. Other liver tests normal. Lipase normal. Abdominal ultrasound revealed cholelithiasis without complication. She was treated symptomatically with GI referral made. CBC at that time did reveal mild leukocytosis with white blood cell count 13.2. She did have an episode of left-sided pain in March after having given birth in February. She did have another episode of pain prior to her office evaluation. I discussed this case with our nurse practitioner. I felt that her recurrent episodic pain was likely related to gallstones and recommended a general surgical opinion. We referred her to [facility] surgery. Unfortunately, she showed up for her appointment only to find out that her insurance was not in network. She contacted our office and apparently had a negative interaction with staff stating that she was "yelled at". Also apparently had a negative interaction with the surgical front desk. In any event, she tells me that she has changed her diet to low fat and meat avoidance. With this she reports resolution of abdominal complaints. She understands the importance of potential surgical evaluation but wishes to hold off at this time. She has no new problems to report  REVIEW OF SYSTEMS:  All non-GI ROS negative upon review  Past Medical History  Diagnosis Date  . GERD (gastroesophageal reflux disease)   . Gallstones     Past Surgical History  Procedure Laterality Date  . Wisdom tooth extraction    . Root canal      Social History Ashley Best  reports that she has never smoked. She has never used smokeless tobacco. She reports that she does not drink alcohol or use illicit  drugs.  family history includes Aneurysm in her maternal grandfather; Breast cancer in her paternal grandmother; Diabetes in her father; Heart attack in her paternal grandfather and paternal uncle; Hypertension in her father and paternal grandfather; Prostate cancer in her father; Stroke in her maternal grandfather. There is no history of Colon cancer, Colon polyps, or Esophageal cancer.  No Known Allergies     PHYSICAL EXAMINATION: Vital signs: BP 108/58 mmHg  Pulse 76  Ht  (1.702 m)  Wt 205 lb 6.4 oz (93.169 kg)  BMI 32.16 kg/m2 General: Well-developed, well-nourished, no acute distress HEENT: Sclerae are anicteric, conjunctiva pink. Oral mucosa intact Lungs: Clear Heart: Regular Abdomen: soft, obese, nontender, nondistended, no obvious ascites, no peritoneal signs, normal bowel sounds. No organomegaly. Extremities: No edema Psychiatric: alert and oriented x3. Cooperative   ASSESSMENT:  #1. Recurrent problems with abdominal pain as described. Suspect symptomatic cholelithiasis. No problems recently. Altered diet as described #2. Cholelithiasis #3. Mild elevation of ALT   PLAN:  #1. Expectant management per her preference. I was clear that she could develop complicated gallbladder disease including acute cholecystitis or gallstone pancreatitis by waiting. She understands. She is agreeable to surgical evaluation should she have further problems with pain #2. GI follow-up as needed  NOTE: I did apologize on behalf of the office for any negative interaction is that she encountered.

## 2016-05-02 IMAGING — US US ABDOMEN LIMITED
1 series · 14 of 25 positions shown · non-contrast
Comparison: None.

CLINICAL DATA: Vomiting and postprandial pain

EXAM:
US ABDOMEN LIMITED - RIGHT UPPER QUADRANT

[Series 1: us abdomen limited · 0.24mm/px · 14 of 39 slices shown]
[im 1/39]
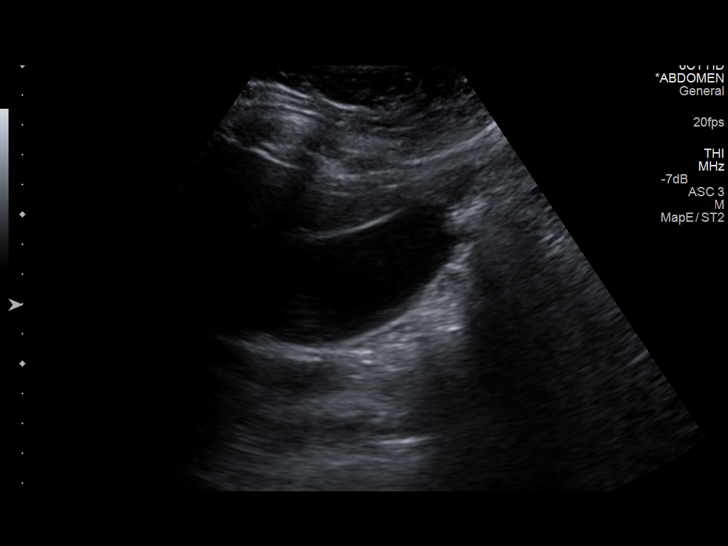
[im 4/39]
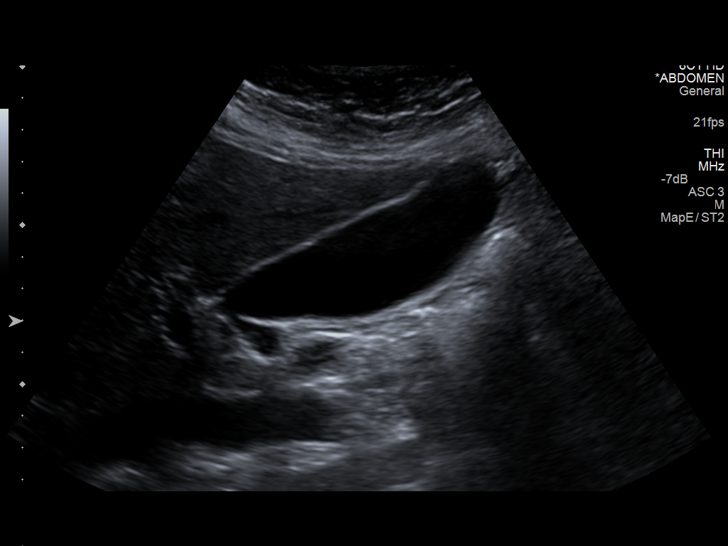
[im 7/39]
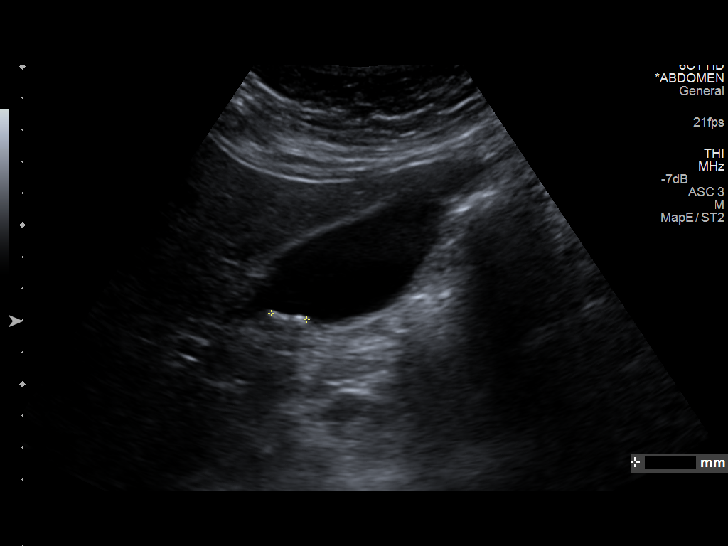
[im 10/39]
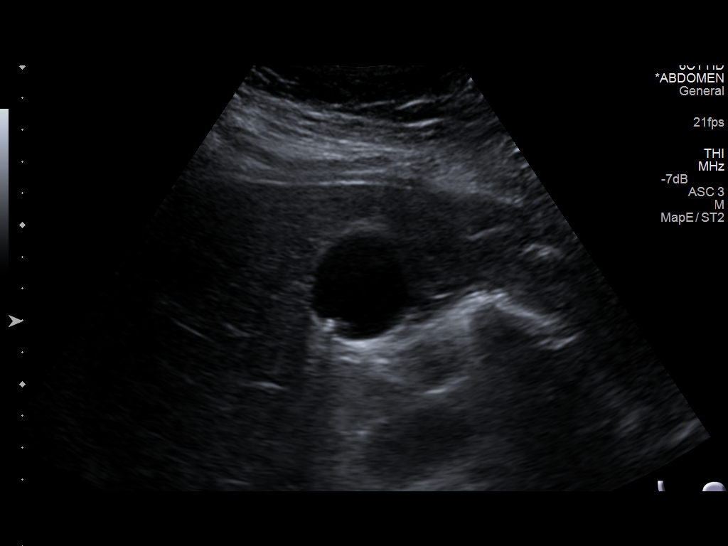
[im 13/39]
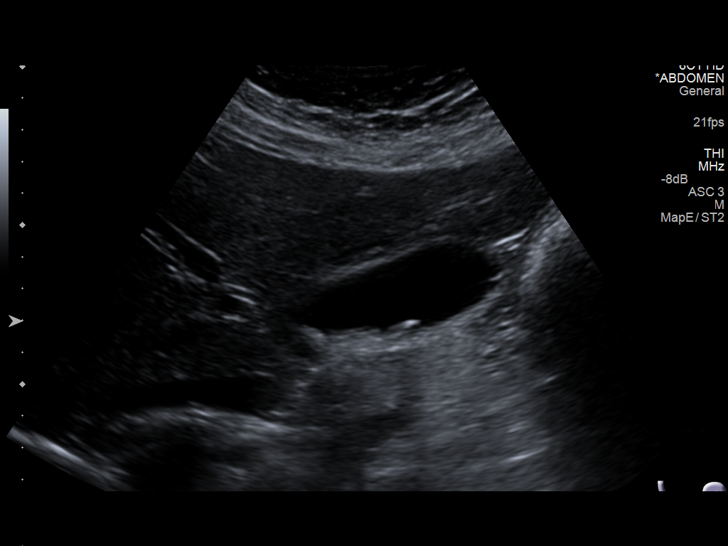
[im 15/39]
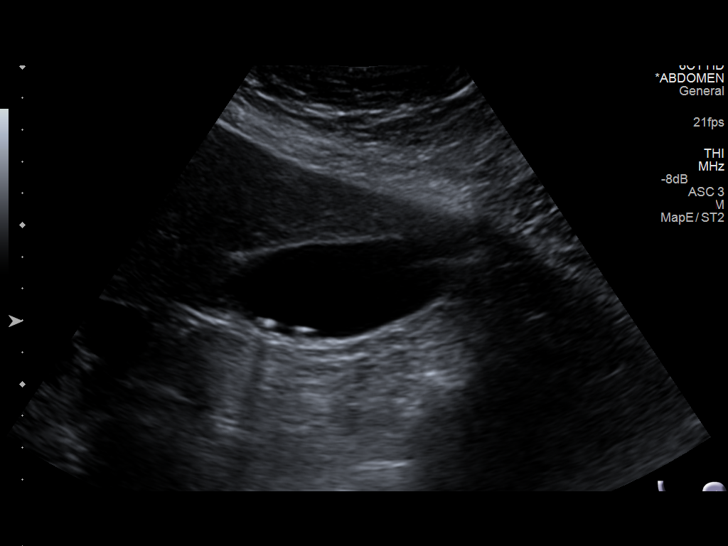
[im 18/39]
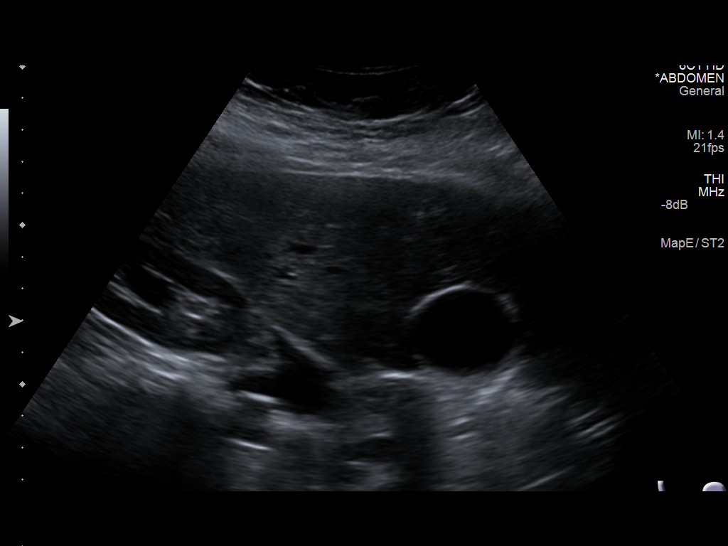
[im 21/39]
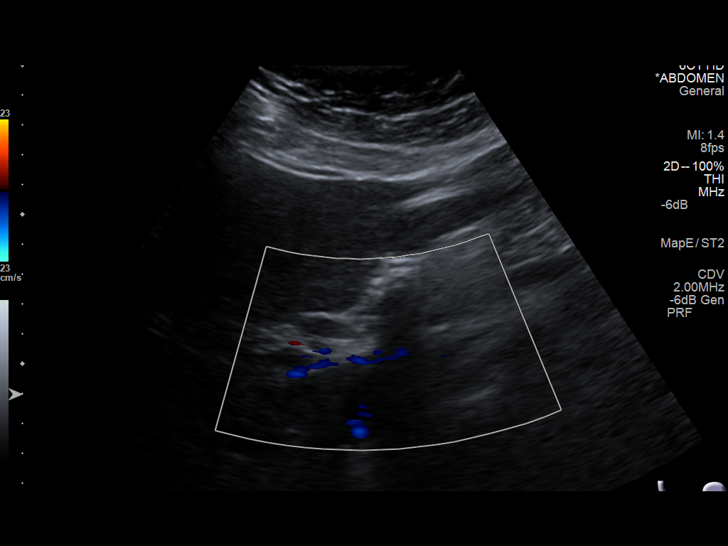
[im 24/39]
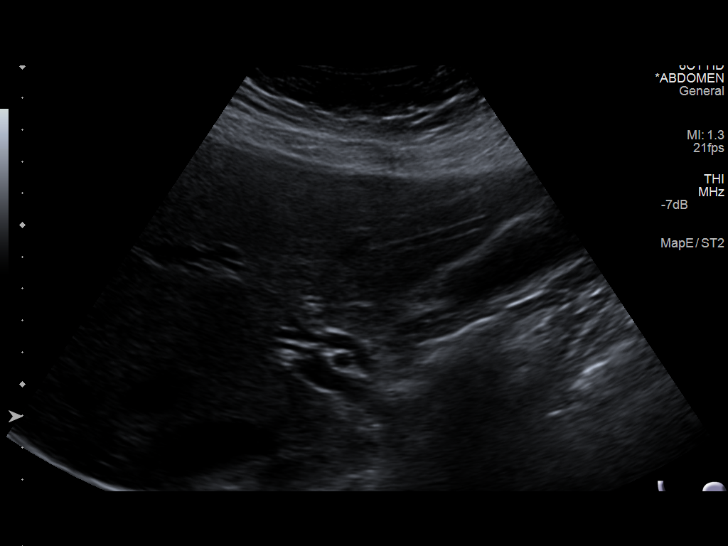
[im 26/39]
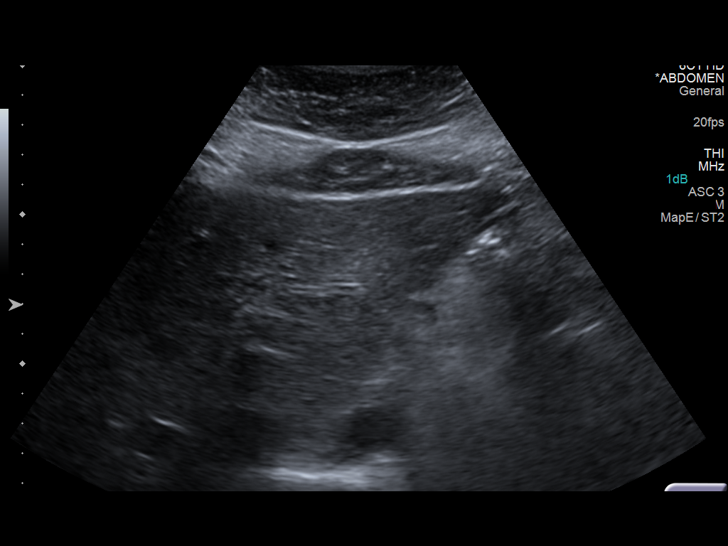
[im 29/39]
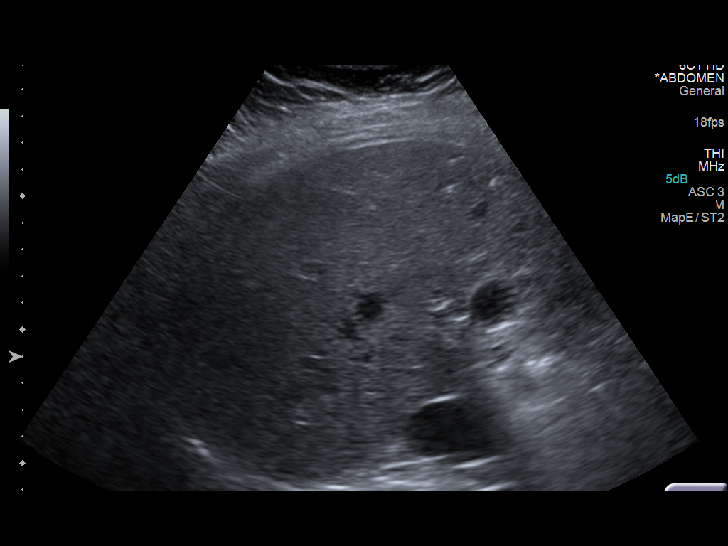
[im 32/39]
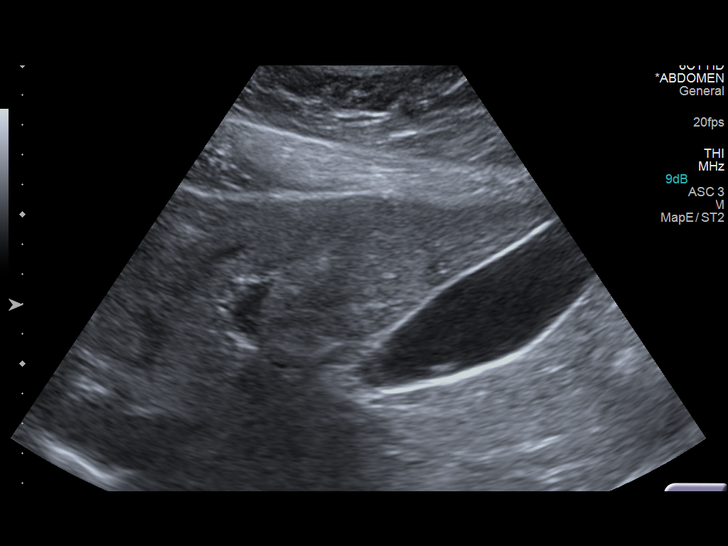
[im 35/39]
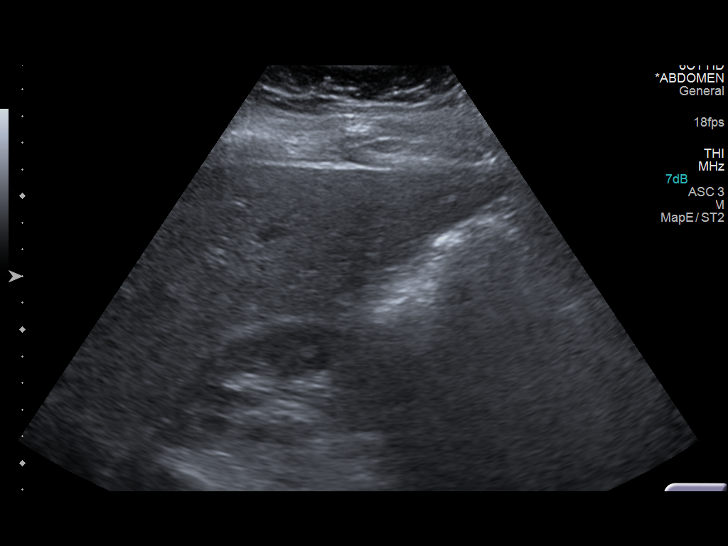
[im 39/39]
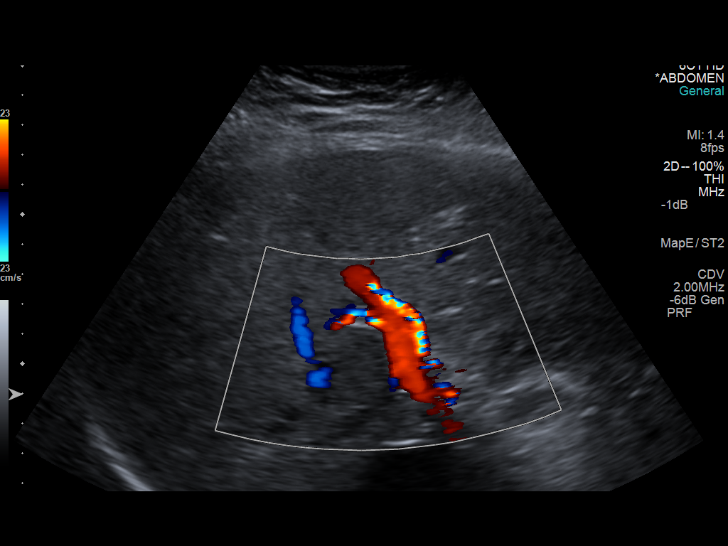

[14 of 25 positions shown; findings below may reference images not displayed]

FINDINGS: Gallbladder:

Within the gallbladder, there are echogenic foci which move and
shadow consistent with gallstones. Largest gallstone measures 1.1 cm
in length. There is no gallbladder wall thickening or
pericholecystic fluid. No sonographic Murphy sign noted.

Common bile duct:

Diameter: 6 mm. There is no intrahepatic or extrahepatic biliary
duct dilatation.

Liver:

No focal lesion identified. Within normal limits in parenchymal
echogenicity.

Incidental note is made of slight fullness of the right renal
collecting system.
IMPRESSION: Cholelithiasis. Gallbladder otherwise appears unremarkable. Patient
is not focally tender over the gallbladder at this time.

Incidental note is made of slight fullness of the right renal
collecting system, a finding of questionable etiology and
significance.

## 2019-05-04 ENCOUNTER — Other Ambulatory Visit: Payer: Self-pay

## 2019-05-04 DIAGNOSIS — Z20822 Contact with and (suspected) exposure to covid-19: Secondary | ICD-10-CM

## 2019-05-05 LAB — NOVEL CORONAVIRUS, NAA: SARS-CoV-2, NAA: NOT DETECTED

## 2019-07-10 ENCOUNTER — Ambulatory Visit: Payer: Self-pay | Attending: Internal Medicine

## 2019-07-10 DIAGNOSIS — Z20822 Contact with and (suspected) exposure to covid-19: Secondary | ICD-10-CM | POA: Insufficient documentation

## 2019-07-11 LAB — NOVEL CORONAVIRUS, NAA: SARS-CoV-2, NAA: NOT DETECTED

## 2023-01-21 ENCOUNTER — Other Ambulatory Visit: Payer: Self-pay

## 2023-01-21 ENCOUNTER — Encounter (HOSPITAL_COMMUNITY): Payer: Self-pay | Admitting: Emergency Medicine

## 2023-01-21 ENCOUNTER — Emergency Department (HOSPITAL_COMMUNITY): Payer: BC Managed Care – PPO

## 2023-01-21 ENCOUNTER — Emergency Department (HOSPITAL_COMMUNITY)
Admission: EM | Admit: 2023-01-21 | Discharge: 2023-01-21 | Disposition: A | Discharge status: Home / Self Care | Payer: BC Managed Care – PPO | Attending: Emergency Medicine | Admitting: Emergency Medicine

## 2023-01-21 DIAGNOSIS — R1012 Left upper quadrant pain: Secondary | ICD-10-CM | POA: Diagnosis present

## 2023-01-21 DIAGNOSIS — A059 Bacterial foodborne intoxication, unspecified: Secondary | ICD-10-CM

## 2023-01-21 DIAGNOSIS — R112 Nausea with vomiting, unspecified: Secondary | ICD-10-CM | POA: Insufficient documentation

## 2023-01-21 DIAGNOSIS — R1011 Right upper quadrant pain: Secondary | ICD-10-CM | POA: Insufficient documentation

## 2023-01-21 DIAGNOSIS — D72829 Elevated white blood cell count, unspecified: Secondary | ICD-10-CM | POA: Diagnosis not present

## 2023-01-21 DIAGNOSIS — N1330 Unspecified hydronephrosis: Secondary | ICD-10-CM

## 2023-01-21 LAB — CBC
HCT: 42.1 % (ref 36.0–46.0)
Hemoglobin: 13.3 g/dL (ref 12.0–15.0)
MCH: 26.8 pg (ref 26.0–34.0)
MCHC: 31.6 g/dL (ref 30.0–36.0)
MCV: 84.7 fL (ref 80.0–100.0)
Platelets: 313 10*3/uL (ref 150–400)
RBC: 4.97 MIL/uL (ref 3.87–5.11)
RDW: 13.1 % (ref 11.5–15.5)
WBC: 12 10*3/uL — ABNORMAL HIGH (ref 4.0–10.5)
nRBC: 0 % (ref 0.0–0.2)

## 2023-01-21 LAB — COMPREHENSIVE METABOLIC PANEL
ALT: 20 U/L (ref 0–44)
AST: 22 U/L (ref 15–41)
Albumin: 4 g/dL (ref 3.5–5.0)
Alkaline Phosphatase: 63 U/L (ref 38–126)
Anion gap: 10 (ref 5–15)
BUN: 12 mg/dL (ref 6–20)
CO2: 23 mmol/L (ref 22–32)
Calcium: 9.1 mg/dL (ref 8.9–10.3)
Chloride: 104 mmol/L (ref 98–111)
Creatinine, Ser: 0.93 mg/dL (ref 0.44–1.00)
GFR, Estimated: >60 mL/min (ref >60)
Glucose, Bld: 139 mg/dL — ABNORMAL HIGH (ref 70–99)
Potassium: 4 mmol/L (ref 3.5–5.1)
Sodium: 137 mmol/L (ref 135–145)
Total Bilirubin: 0.7 mg/dL (ref 0.3–1.2)
Total Protein: 7.2 g/dL (ref 6.5–8.1)

## 2023-01-21 LAB — HCG, SERUM, QUALITATIVE: Preg, Serum: NEGATIVE

## 2023-01-21 LAB — LIPASE, BLOOD: Lipase: 30 U/L (ref 11–51)

## 2023-01-21 MED ORDER — DICYCLOMINE HCL 20 MG PO TABS: 20.0000 mg | ORAL_TABLET | Freq: Two times a day (BID) | ORAL | 0 refills | Status: AC

## 2023-01-21 MED ORDER — SODIUM CHLORIDE 0.9 % IV BOLUS
500.0000 mL | Freq: Once | INTRAVENOUS | Status: AC
  Administered 2023-01-21: 500 mL via INTRAVENOUS

## 2023-01-21 MED ORDER — KETOROLAC TROMETHAMINE 30 MG/ML IJ SOLN
30.0000 mg | Freq: Once | INTRAMUSCULAR | Status: AC
  Administered 2023-01-21: 30 mg via INTRAVENOUS
  Filled 2023-01-21: qty 1

## 2023-01-21 MED ORDER — ONDANSETRON 4 MG PO TBDP
4.0000 mg | ORAL_TABLET | Freq: Once | ORAL | Status: AC | PRN
  Administered 2023-01-21: 4 mg via ORAL
  Filled 2023-01-21: qty 1

## 2023-01-21 MED ORDER — IOHEXOL 350 MG/ML SOLN
75.0000 mL | Freq: Once | INTRAVENOUS | Status: AC | PRN
  Administered 2023-01-21: 75 mL via INTRAVENOUS

## 2023-01-21 MED ORDER — ONDANSETRON 8 MG PO TBDP: ORAL_TABLET | ORAL | 0 refills | Status: AC

## 2023-01-21 MED ORDER — DICYCLOMINE HCL 10 MG/ML IM SOLN
20.0000 mg | Freq: Once | INTRAMUSCULAR | Status: AC
  Administered 2023-01-21: 20 mg via INTRAMUSCULAR
  Filled 2023-01-21: qty 2

## 2023-01-21 NOTE — ED Provider Notes (Signed)
[place] EMERGENCY DEPARTMENT AT [facility] Provider Note   CSN: [id number] Arrival date & time: 01/21/23  4098     History  Chief Complaint  Patient presents with   Abdominal Pain    Ashley Best is a 42 y.o. female.  The history is provided by the patient.  Abdominal Pain Pain location:  LUQ Pain radiates to:  Does not radiate Pain severity:  Severe Onset quality:  Sudden Timing:  Constant Progression:  Unchanged Chronicity:  New Context: eating   Context: not alcohol use   Relieved by:  Nothing Worsened by:  Nothing Ineffective treatments:  None tried Associated symptoms: nausea and vomiting   Associated symptoms: no fever   Risk factors: not pregnant   Patient who ate food out of a fridge that wasn't working, then ate some sushi now having vomiting and LUQ abdominal pain      Home Medications Prior to Admission medications   Medication Sig Start Date End Date Taking? Authorizing Provider  acetaminophen (TYLENOL) 500 MG tablet Take 500 mg by mouth every 6 (six) hours as needed for headache.    [provider]  etonogestrel (NEXPLANON) 68 MG IMPL implant 1 each by Subdermal route once. Every 3 years    [provider]      Allergies    Patient has no known allergies.    Review of Systems   Review of Systems  Constitutional:  Negative for fever.  HENT:  Negative for facial swelling.   Eyes:  Negative for redness.  Gastrointestinal:  Positive for abdominal pain, nausea and vomiting.  All other systems reviewed and are negative.   Physical Exam Updated Vital Signs BP 133/70   Pulse 78   Temp 98 F (36.7 C) (Oral)   Resp 18   Ht 5\' 7"  (1.702 m)   Wt 104.3 kg   LMP 01/01/2023   SpO2 96%   BMI 36.02 kg/m  Physical Exam Vitals and nursing note reviewed.  Constitutional:      General: She is not in acute distress.    Appearance: Normal appearance. She is well-developed.  HENT:     Head: Normocephalic and  atraumatic.     Nose: Nose normal.  Eyes:     Pupils: Pupils are equal, round, and reactive to light.  Cardiovascular:     Rate and Rhythm: Normal rate and regular rhythm.     Pulses: Normal pulses.     Heart sounds: Normal heart sounds.  Pulmonary:     Effort: Pulmonary effort is normal. No respiratory distress.     Breath sounds: Normal breath sounds.  Abdominal:     General: Bowel sounds are normal. There is no distension.     Palpations: Abdomen is soft.     Tenderness: There is no abdominal tenderness. There is no guarding or rebound.  Musculoskeletal:        General: Normal range of motion.     Cervical back: Neck supple.  Skin:    General: Skin is warm and dry.     Capillary Refill: Capillary refill takes less than 2 seconds.     Findings: No erythema or rash.  Neurological:     General: No focal deficit present.     Mental Status: She is alert and oriented to person, place, and time.     Deep Tendon Reflexes: Reflexes normal.  Psychiatric:        Mood and Affect: Mood normal.     ED Results /  Procedures / Treatments   Labs (all labs ordered are listed, but only abnormal results are displayed) Results for orders placed or performed during the hospital encounter of 01/21/23  Lipase, blood  Result Value Ref Range   Lipase 30 11 - 51 U/L  Comprehensive metabolic panel  Result Value Ref Range   Sodium 137 135 - 145 mmol/L   Potassium 4.0 3.5 - 5.1 mmol/L   Chloride 104 98 - 111 mmol/L   CO2 23 22 - 32 mmol/L   Glucose, Bld 139 (H) 70 - 99 mg/dL   BUN 12 6 - 20 mg/dL   Creatinine, Ser 3.32 0.44 - 1.00 mg/dL   Calcium 9.1 8.9 - 95.1 mg/dL   Total Protein 7.2 6.5 - 8.1 g/dL   Albumin 4.0 3.5 - 5.0 g/dL   AST 22 15 - 41 U/L   ALT 20 0 - 44 U/L   Alkaline Phosphatase 63 38 - 126 U/L   Total Bilirubin 0.7 0.3 - 1.2 mg/dL   GFR, Estimated >88 >41 mL/min   Anion gap 10 5 - 15  CBC  Result Value Ref Range   WBC 12.0 (H) 4.0 - 10.5 K/uL   RBC 4.97 3.87 - 5.11  MIL/uL   Hemoglobin 13.3 12.0 - 15.0 g/dL   HCT 66.0 63.0 - 16.0 %   MCV 84.7 80.0 - 100.0 fL   MCH 26.8 26.0 - 34.0 pg   MCHC 31.6 30.0 - 36.0 g/dL   RDW 10.9 32.3 - 55.7 %   Platelets 313 150 - 400 K/uL   nRBC 0.0 0.0 - 0.2 %  hCG, serum, qualitative  Result Value Ref Range   Preg, Serum NEGATIVE NEGATIVE   CT ABDOMEN PELVIS W CONTRAST  Result Date: 01/21/2023 CLINICAL DATA:  Poly trauma blunt.  Gastric pain. EXAM: CT ABDOMEN AND PELVIS WITH CONTRAST TECHNIQUE: Multidetector CT imaging of the abdomen and pelvis was performed using the standard protocol following bolus administration of intravenous contrast. RADIATION DOSE REDUCTION: This exam was performed according to the departmental dose-optimization program which includes automated exposure control, adjustment of the mA and/or kV according to patient size and/or use of iterative reconstruction technique. CONTRAST:  75mL OMNIPAQUE IOHEXOL 350 MG/ML SOLN COMPARISON:  Right upper quadrant ultrasound 12/12/2014 FINDINGS: Lower chest: Minimal atelectasis is present at the lung bases. Lungs are otherwise clear. The heart size is normal. No significant pleural or pericardial effusion is present. Hepatobiliary: Mild diffuse fatty infiltration of the liver is present. No discrete lesions are present. No focal trauma is present. Large radiopaque gallstones measure up to 16 mm. No inflammatory changes are present the gallbladder. The common bile duct is within normal limits. Pancreas: Unremarkable. No pancreatic ductal dilatation or surrounding inflammatory changes. Spleen: Normal in size without focal abnormality. Adrenals/Urinary Tract: The adrenal glands are normal bilaterally. Moderate right-sided hydronephrosis is again noted, similar to the prior ultrasound. Right UPJ stenosis is present. A 12 mm nonobstructing stone is present at the lower pole of the right kidney. A no other significant nephrolithiasis is present. The ureters are otherwise within  normal limits. The urinary bladder is normal. Stomach/Bowel: Stomach and duodenum are within normal limits. The small bowel is unremarkable. The terminal ileum is within normal limits. The appendix is visualized and normal. The ascending and transverse colon within normal limits. The descending and sigmoid colon are normal. Vascular/Lymphatic: No significant vascular findings are present. No enlarged abdominal or pelvic lymph nodes. Reproductive: A small amount of fluid is present within the  endometrial cavity, likely physiologic. Please correlate with menstrual cycle. Uterus and adnexa are otherwise within normal limits. Other: No abdominal wall hernia or abnormality. No abdominopelvic ascites. Musculoskeletal: The vertebral body heights and alignment are normal. No acute fractures are present. Straightening of the normal lumbar lordosis is present. The bony pelvis is within normal limits. The hips are located and within normal limits bilaterally. IMPRESSION: 1. No acute trauma to the abdomen or pelvis. 2. Hepatic steatosis. 3. Cholelithiasis without evidence of cholecystitis. 4. Moderate right-sided hydronephrosis, similar to the prior ultrasound. Right UPJ stenosis is present. 5. 12 mm nonobstructing stone at the lower pole of the right kidney. 6. Small amount of fluid within the endometrial cavity, likely physiologic. Please correlate with menstrual cycle. Electronically Signed   By: Marin Roberts M.D.   On: 01/21/2023 07:28     Radiology No results found.  Procedures Procedures    Medications Ordered in ED Medications  ketorolac (TORADOL) 30 MG/ML injection 30 mg (has no administration in time range)  ondansetron (ZOFRAN-ODT) disintegrating tablet 4 mg (4 mg Oral Given 01/21/23 0327)  sodium chloride 0.9 % bolus 500 mL (0 mLs Intravenous Stopped 01/21/23 0656)  dicyclomine (BENTYL) injection 20 mg (20 mg Intramuscular Given 01/21/23 0546)  sodium chloride 0.9 % bolus 500 mL (0 mLs  Intravenous Stopped 01/21/23 0656)  iohexol (OMNIPAQUE) 350 MG/ML injection 75 mL (75 mLs Intravenous Contrast Given 01/21/23 6213)    ED Course/ Medical Decision Making/ A&P                                 Medical Decision Making LUQ pain and vomiting post eating sushi   Amount and/or Complexity of Data Reviewed External Data Reviewed: notes.    Details: Previous notes reviewed  Labs: ordered.    Details: Lipase normal 30, pregnancy negative, normal sodium 137, normal potassium 4, normal creatinine normal LFTs.  White count slight elevation 12  Radiology: ordered and independent interpretation performed.    Details: Hydronephrosis by  me on CT  Risk Prescription drug management. Risk Details: Symptoms consistent with food poisoning secondary to eating food out of a fridge that is not working.  Will need follow up of incidental finding of hydronephrosis with urology.  Stable for discharge with close follow up.  Bland diet instructions given.      Final Clinical Impression(s) / ED Diagnoses Final diagnoses:  None   Return for intractable cough, coughing up blood, fevers > 100.4 unrelieved by medication, shortness of breath, intractable vomiting, chest pain, shortness of breath, weakness, numbness, changes in speech, facial asymmetry, abdominal pain, passing out, Inability to tolerate liquids or food, cough, altered mental status or any concerns. No signs of systemic illness or infection. The patient is nontoxic-appearing on exam and vital signs are within normal limits.  I have reviewed the triage vital signs and the nursing notes. Pertinent labs & imaging results that were available during my care of the patient were reviewed by me and considered in my medical decision making (see chart for details). After history, exam, and medical workup I feel the patient has been appropriately medically screened and is safe for discharge home. Pertinent diagnoses were discussed with the patient.  Patient was given return precautions.    Rx / DC Orders ED Discharge Orders     None         Amyjo Mizrachi, MD 01/21/23 [phone]

## 2023-01-21 NOTE — ED Notes (Signed)
Pt verbalized understanding of discharge instructions. Opportunity for questions provided.  

## 2023-01-21 NOTE — ED Triage Notes (Signed)
Pt c/o upper abd pain and left abd pain with emesis since 2330
